# Patient Record
Sex: Female | Born: 1935 | Race: White | Hispanic: No | Smoking: Never smoker
Health system: Southern US, Community
[De-identification: ages and names within clinical notes are randomized; demographics above are authoritative.]

## PROBLEM LIST (undated history)

## (undated) DIAGNOSIS — I4891 Unspecified atrial fibrillation: Secondary | ICD-10-CM

## (undated) DIAGNOSIS — J449 Chronic obstructive pulmonary disease, unspecified: Secondary | ICD-10-CM

## (undated) DIAGNOSIS — I639 Cerebral infarction, unspecified: Secondary | ICD-10-CM

## (undated) DIAGNOSIS — F0390 Unspecified dementia without behavioral disturbance: Secondary | ICD-10-CM

## (undated) DIAGNOSIS — I2510 Atherosclerotic heart disease of native coronary artery without angina pectoris: Secondary | ICD-10-CM

## (undated) DIAGNOSIS — J45909 Unspecified asthma, uncomplicated: Secondary | ICD-10-CM

## (undated) DIAGNOSIS — I10 Essential (primary) hypertension: Secondary | ICD-10-CM

## (undated) DIAGNOSIS — N189 Chronic kidney disease, unspecified: Secondary | ICD-10-CM

## (undated) HISTORY — PX: APPENDECTOMY: SHX54

## (undated) HISTORY — PX: OTHER SURGICAL HISTORY: SHX169

---

## 2019-02-15 ENCOUNTER — Inpatient Hospital Stay (HOSPITAL_COMMUNITY)
Admission: EM | Admit: 2019-02-15 | Discharge: 2019-02-28 | DRG: 308 | Disposition: A | Discharge status: Skilled Nursing Facility | Payer: Medicare Other | Source: Other Acute Inpatient Hospital | Attending: Family Medicine | Admitting: Family Medicine

## 2019-02-15 DIAGNOSIS — I4819 Other persistent atrial fibrillation: Secondary | ICD-10-CM | POA: Diagnosis not present

## 2019-02-15 DIAGNOSIS — Z7189 Other specified counseling: Secondary | ICD-10-CM

## 2019-02-15 DIAGNOSIS — N12 Tubulo-interstitial nephritis, not specified as acute or chronic: Secondary | ICD-10-CM | POA: Diagnosis not present

## 2019-02-15 DIAGNOSIS — M899 Disorder of bone, unspecified: Secondary | ICD-10-CM | POA: Diagnosis not present

## 2019-02-15 DIAGNOSIS — Z66 Do not resuscitate: Secondary | ICD-10-CM | POA: Diagnosis not present

## 2019-02-15 DIAGNOSIS — N390 Urinary tract infection, site not specified: Secondary | ICD-10-CM | POA: Diagnosis present

## 2019-02-15 DIAGNOSIS — Z7901 Long term (current) use of anticoagulants: Secondary | ICD-10-CM | POA: Diagnosis not present

## 2019-02-15 DIAGNOSIS — I959 Hypotension, unspecified: Secondary | ICD-10-CM | POA: Diagnosis present

## 2019-02-15 DIAGNOSIS — R627 Adult failure to thrive: Secondary | ICD-10-CM | POA: Diagnosis not present

## 2019-02-15 DIAGNOSIS — L8990 Pressure ulcer of unspecified site, unspecified stage: Secondary | ICD-10-CM | POA: Diagnosis present

## 2019-02-15 DIAGNOSIS — I2510 Atherosclerotic heart disease of native coronary artery without angina pectoris: Secondary | ICD-10-CM | POA: Diagnosis present

## 2019-02-15 DIAGNOSIS — Z9981 Dependence on supplemental oxygen: Secondary | ICD-10-CM | POA: Diagnosis not present

## 2019-02-15 DIAGNOSIS — R222 Localized swelling, mass and lump, trunk: Secondary | ICD-10-CM | POA: Diagnosis present

## 2019-02-15 DIAGNOSIS — J45909 Unspecified asthma, uncomplicated: Secondary | ICD-10-CM | POA: Diagnosis present

## 2019-02-15 DIAGNOSIS — I714 Abdominal aortic aneurysm, without rupture, unspecified: Secondary | ICD-10-CM | POA: Diagnosis present

## 2019-02-15 DIAGNOSIS — Z20828 Contact with and (suspected) exposure to other viral communicable diseases: Secondary | ICD-10-CM | POA: Diagnosis present

## 2019-02-15 DIAGNOSIS — E8770 Fluid overload, unspecified: Secondary | ICD-10-CM | POA: Diagnosis not present

## 2019-02-15 DIAGNOSIS — E079 Disorder of thyroid, unspecified: Secondary | ICD-10-CM

## 2019-02-15 DIAGNOSIS — F0150 Vascular dementia without behavioral disturbance: Secondary | ICD-10-CM | POA: Diagnosis present

## 2019-02-15 DIAGNOSIS — Z515 Encounter for palliative care: Secondary | ICD-10-CM

## 2019-02-15 DIAGNOSIS — Z9889 Other specified postprocedural states: Secondary | ICD-10-CM | POA: Diagnosis not present

## 2019-02-15 DIAGNOSIS — I360 Nonrheumatic tricuspid (valve) stenosis: Secondary | ICD-10-CM | POA: Diagnosis not present

## 2019-02-15 DIAGNOSIS — S22000A Wedge compression fracture of unspecified thoracic vertebra, initial encounter for closed fracture: Secondary | ICD-10-CM | POA: Diagnosis not present

## 2019-02-15 DIAGNOSIS — Z955 Presence of coronary angioplasty implant and graft: Secondary | ICD-10-CM | POA: Diagnosis not present

## 2019-02-15 DIAGNOSIS — J90 Pleural effusion, not elsewhere classified: Secondary | ICD-10-CM | POA: Diagnosis not present

## 2019-02-15 DIAGNOSIS — Z6821 Body mass index (BMI) 21.0-21.9, adult: Secondary | ICD-10-CM

## 2019-02-15 DIAGNOSIS — N189 Chronic kidney disease, unspecified: Secondary | ICD-10-CM | POA: Diagnosis not present

## 2019-02-15 DIAGNOSIS — M8008XA Age-related osteoporosis with current pathological fracture, vertebra(e), initial encounter for fracture: Secondary | ICD-10-CM | POA: Diagnosis present

## 2019-02-15 DIAGNOSIS — E049 Nontoxic goiter, unspecified: Secondary | ICD-10-CM | POA: Diagnosis present

## 2019-02-15 DIAGNOSIS — I4891 Unspecified atrial fibrillation: Secondary | ICD-10-CM | POA: Diagnosis present

## 2019-02-15 DIAGNOSIS — L89151 Pressure ulcer of sacral region, stage 1: Secondary | ICD-10-CM | POA: Diagnosis present

## 2019-02-15 DIAGNOSIS — Z888 Allergy status to other drugs, medicaments and biological substances status: Secondary | ICD-10-CM

## 2019-02-15 DIAGNOSIS — I340 Nonrheumatic mitral (valve) insufficiency: Secondary | ICD-10-CM | POA: Diagnosis not present

## 2019-02-15 DIAGNOSIS — I69320 Aphasia following cerebral infarction: Secondary | ICD-10-CM | POA: Diagnosis not present

## 2019-02-15 DIAGNOSIS — N183 Chronic kidney disease, stage 3 (moderate): Secondary | ICD-10-CM | POA: Diagnosis present

## 2019-02-15 DIAGNOSIS — E785 Hyperlipidemia, unspecified: Secondary | ICD-10-CM | POA: Diagnosis present

## 2019-02-15 DIAGNOSIS — J9621 Acute and chronic respiratory failure with hypoxia: Secondary | ICD-10-CM | POA: Diagnosis present

## 2019-02-15 DIAGNOSIS — D34 Benign neoplasm of thyroid gland: Secondary | ICD-10-CM | POA: Diagnosis not present

## 2019-02-15 DIAGNOSIS — R229 Localized swelling, mass and lump, unspecified: Secondary | ICD-10-CM

## 2019-02-15 DIAGNOSIS — R4182 Altered mental status, unspecified: Secondary | ICD-10-CM | POA: Diagnosis present

## 2019-02-15 DIAGNOSIS — J449 Chronic obstructive pulmonary disease, unspecified: Secondary | ICD-10-CM | POA: Diagnosis present

## 2019-02-15 DIAGNOSIS — R0902 Hypoxemia: Secondary | ICD-10-CM | POA: Diagnosis present

## 2019-02-15 DIAGNOSIS — G92 Toxic encephalopathy: Secondary | ICD-10-CM | POA: Diagnosis present

## 2019-02-15 DIAGNOSIS — IMO0002 Reserved for concepts with insufficient information to code with codable children: Secondary | ICD-10-CM

## 2019-02-15 DIAGNOSIS — I129 Hypertensive chronic kidney disease with stage 1 through stage 4 chronic kidney disease, or unspecified chronic kidney disease: Secondary | ICD-10-CM | POA: Diagnosis not present

## 2019-02-15 DIAGNOSIS — E876 Hypokalemia: Secondary | ICD-10-CM | POA: Diagnosis present

## 2019-02-15 DIAGNOSIS — F0390 Unspecified dementia without behavioral disturbance: Secondary | ICD-10-CM | POA: Diagnosis not present

## 2019-02-15 DIAGNOSIS — J439 Emphysema, unspecified: Secondary | ICD-10-CM | POA: Diagnosis not present

## 2019-02-15 DIAGNOSIS — I480 Paroxysmal atrial fibrillation: Principal | ICD-10-CM | POA: Diagnosis present

## 2019-02-15 DIAGNOSIS — E041 Nontoxic single thyroid nodule: Secondary | ICD-10-CM | POA: Diagnosis not present

## 2019-02-15 HISTORY — DX: Unspecified atrial fibrillation: I48.91

## 2019-02-15 HISTORY — DX: Chronic obstructive pulmonary disease, unspecified: J44.9

## 2019-02-15 HISTORY — DX: Essential (primary) hypertension: I10

## 2019-02-15 HISTORY — DX: Chronic kidney disease, unspecified: N18.9

## 2019-02-15 HISTORY — DX: Unspecified dementia, unspecified severity, without behavioral disturbance, psychotic disturbance, mood disturbance, and anxiety: F03.90

## 2019-02-15 HISTORY — DX: Unspecified asthma, uncomplicated: J45.909

## 2019-02-15 HISTORY — DX: Atherosclerotic heart disease of native coronary artery without angina pectoris: I25.10

## 2019-02-15 HISTORY — DX: Cerebral infarction, unspecified: I63.9

## 2019-02-15 MED ORDER — CHLORHEXIDINE GLUCONATE CLOTH 2 % EX PADS
6.0000 | MEDICATED_PAD | Freq: Every day | CUTANEOUS | Status: DC
  Administered 2019-02-16 – 2019-02-21 (×6): 6 via TOPICAL

## 2019-02-16 ENCOUNTER — Encounter (HOSPITAL_COMMUNITY): Payer: Self-pay | Admitting: Internal Medicine

## 2019-02-16 ENCOUNTER — Inpatient Hospital Stay (HOSPITAL_COMMUNITY): Payer: Medicare Other

## 2019-02-16 DIAGNOSIS — J90 Pleural effusion, not elsewhere classified: Secondary | ICD-10-CM | POA: Diagnosis present

## 2019-02-16 DIAGNOSIS — E049 Nontoxic goiter, unspecified: Secondary | ICD-10-CM | POA: Diagnosis present

## 2019-02-16 DIAGNOSIS — I360 Nonrheumatic tricuspid (valve) stenosis: Secondary | ICD-10-CM

## 2019-02-16 DIAGNOSIS — I2510 Atherosclerotic heart disease of native coronary artery without angina pectoris: Secondary | ICD-10-CM

## 2019-02-16 DIAGNOSIS — I4891 Unspecified atrial fibrillation: Secondary | ICD-10-CM

## 2019-02-16 DIAGNOSIS — J45909 Unspecified asthma, uncomplicated: Secondary | ICD-10-CM | POA: Diagnosis present

## 2019-02-16 DIAGNOSIS — I714 Abdominal aortic aneurysm, without rupture, unspecified: Secondary | ICD-10-CM | POA: Diagnosis present

## 2019-02-16 DIAGNOSIS — R0902 Hypoxemia: Secondary | ICD-10-CM

## 2019-02-16 DIAGNOSIS — I4819 Other persistent atrial fibrillation: Secondary | ICD-10-CM

## 2019-02-16 DIAGNOSIS — L8990 Pressure ulcer of unspecified site, unspecified stage: Secondary | ICD-10-CM

## 2019-02-16 DIAGNOSIS — R4182 Altered mental status, unspecified: Secondary | ICD-10-CM

## 2019-02-16 DIAGNOSIS — Z955 Presence of coronary angioplasty implant and graft: Secondary | ICD-10-CM

## 2019-02-16 DIAGNOSIS — E079 Disorder of thyroid, unspecified: Secondary | ICD-10-CM | POA: Diagnosis present

## 2019-02-16 DIAGNOSIS — S22000A Wedge compression fracture of unspecified thoracic vertebra, initial encounter for closed fracture: Secondary | ICD-10-CM | POA: Diagnosis present

## 2019-02-16 DIAGNOSIS — I340 Nonrheumatic mitral (valve) insufficiency: Secondary | ICD-10-CM

## 2019-02-16 DIAGNOSIS — N390 Urinary tract infection, site not specified: Secondary | ICD-10-CM | POA: Diagnosis present

## 2019-02-16 LAB — URINALYSIS, ROUTINE W REFLEX MICROSCOPIC
Bilirubin Urine: NEGATIVE
Glucose, UA: NEGATIVE mg/dL
Ketones, ur: NEGATIVE mg/dL
Nitrite: NEGATIVE
Protein, ur: NEGATIVE mg/dL
Specific Gravity, Urine: 1.012 (ref 1.005–1.030)
pH: 6 (ref 5.0–8.0)

## 2019-02-16 LAB — COMPREHENSIVE METABOLIC PANEL
ALT: 152 U/L — ABNORMAL HIGH (ref 0–44)
AST: 62 U/L — ABNORMAL HIGH (ref 15–41)
Albumin: 3.6 g/dL (ref 3.5–5.0)
Alkaline Phosphatase: 120 U/L (ref 38–126)
Anion gap: 13 (ref 5–15)
BUN: 49 mg/dL — ABNORMAL HIGH (ref 8–23)
CO2: 26 mmol/L (ref 22–32)
Calcium: 8.8 mg/dL — ABNORMAL LOW (ref 8.9–10.3)
Chloride: 97 mmol/L — ABNORMAL LOW (ref 98–111)
Creatinine, Ser: 1.71 mg/dL — ABNORMAL HIGH (ref 0.44–1.00)
GFR calc Af Amer: 32 mL/min — ABNORMAL LOW (ref >60)
GFR calc non Af Amer: 28 mL/min — ABNORMAL LOW (ref >60)
Glucose, Bld: 124 mg/dL — ABNORMAL HIGH (ref 70–99)
Potassium: 4.1 mmol/L (ref 3.5–5.1)
Sodium: 136 mmol/L (ref 135–145)
Total Bilirubin: 1.4 mg/dL — ABNORMAL HIGH (ref 0.3–1.2)
Total Protein: 6.3 g/dL — ABNORMAL LOW (ref 6.5–8.1)

## 2019-02-16 LAB — ECHOCARDIOGRAM COMPLETE
Height: 62 in
Weight: 1904.77 oz

## 2019-02-16 LAB — APTT
aPTT: 31 seconds (ref 24–36)
aPTT: 82 seconds — ABNORMAL HIGH (ref 24–36)

## 2019-02-16 LAB — MAGNESIUM: Magnesium: 2.1 mg/dL (ref 1.7–2.4)

## 2019-02-16 LAB — PROTIME-INR
INR: 1.6 — ABNORMAL HIGH (ref 0.8–1.2)
Prothrombin Time: 18.5 seconds — ABNORMAL HIGH (ref 11.4–15.2)

## 2019-02-16 LAB — CBC
HCT: 39.8 % (ref 36.0–46.0)
Hemoglobin: 11.4 g/dL — ABNORMAL LOW (ref 12.0–15.0)
MCH: 23.8 pg — ABNORMAL LOW (ref 26.0–34.0)
MCHC: 28.6 g/dL — ABNORMAL LOW (ref 30.0–36.0)
MCV: 83.1 fL (ref 80.0–100.0)
Platelets: 175 10*3/uL (ref 150–400)
RBC: 4.79 MIL/uL (ref 3.87–5.11)
RDW: 21.2 % — ABNORMAL HIGH (ref 11.5–15.5)
WBC: 9.7 10*3/uL (ref 4.0–10.5)
nRBC: 0.9 % — ABNORMAL HIGH (ref 0.0–0.2)

## 2019-02-16 LAB — T4, FREE: Free T4: 0.87 ng/dL (ref 0.82–1.77)

## 2019-02-16 LAB — HEPARIN LEVEL (UNFRACTIONATED)
Heparin Unfractionated: 0.86 IU/mL — ABNORMAL HIGH (ref 0.30–0.70)
Heparin Unfractionated: 0.71 IU/mL — ABNORMAL HIGH (ref 0.30–0.70)

## 2019-02-16 LAB — SODIUM, URINE, RANDOM: Sodium, Ur: 40 mmol/L

## 2019-02-16 LAB — MRSA PCR SCREENING: MRSA by PCR: NEGATIVE

## 2019-02-16 LAB — TROPONIN I: Troponin I: <0.03 ng/mL (ref <0.03)

## 2019-02-16 LAB — TSH: TSH: 4.301 u[IU]/mL (ref 0.350–4.500)

## 2019-02-16 LAB — CREATININE, URINE, RANDOM: Creatinine, Urine: 62.92 mg/dL

## 2019-02-16 MED ORDER — ONDANSETRON HCL 4 MG/2ML IJ SOLN
4.0000 mg | Freq: Four times a day (QID) | INTRAMUSCULAR | Status: DC | PRN
  Administered 2019-02-17 – 2019-02-23 (×2): 4 mg via INTRAVENOUS
  Filled 2019-02-16 (×2): qty 2

## 2019-02-16 MED ORDER — DILTIAZEM HCL 100 MG IV SOLR
5.0000 mg/h | INTRAVENOUS | Status: DC
  Administered 2019-02-16: 15:00:00 7.5 mg/h via INTRAVENOUS
  Administered 2019-02-16 – 2019-02-18 (×2): 5 mg/h via INTRAVENOUS
  Filled 2019-02-16 (×6): qty 100

## 2019-02-16 MED ORDER — SODIUM CHLORIDE 0.9 % IV SOLN
1.0000 g | INTRAVENOUS | Status: DC
  Administered 2019-02-16 – 2019-02-18 (×3): 1 g via INTRAVENOUS
  Filled 2019-02-16: qty 1
  Filled 2019-02-16 (×5): qty 10

## 2019-02-16 MED ORDER — HEPARIN (PORCINE) 25000 UT/250ML-% IV SOLN: 700.0000 [IU]/h | INTRAVENOUS | Status: DC

## 2019-02-16 MED ORDER — PERFLUTREN LIPID MICROSPHERE
1.0000 mL | INTRAVENOUS | Status: AC | PRN
  Administered 2019-02-16: 08:00:00 3 mL via INTRAVENOUS
  Filled 2019-02-16: qty 10

## 2019-02-16 MED ORDER — HEPARIN (PORCINE) 25000 UT/250ML-% IV SOLN
650.0000 [IU]/h | INTRAVENOUS | Status: DC
  Filled 2019-02-16: qty 250

## 2019-02-16 MED ORDER — HEPARIN BOLUS VIA INFUSION
3000.0000 [IU] | Freq: Once | INTRAVENOUS | Status: AC
  Administered 2019-02-16: 02:00:00 3000 [IU] via INTRAVENOUS
  Filled 2019-02-16: qty 3000

## 2019-02-16 MED ORDER — ONDANSETRON HCL 4 MG PO TABS: 4.0000 mg | ORAL_TABLET | Freq: Four times a day (QID) | ORAL | Status: DC | PRN

## 2019-02-16 MED ORDER — SODIUM CHLORIDE 0.45 % IV SOLN
INTRAVENOUS | Status: DC
  Administered 2019-02-16 – 2019-02-23 (×8): via INTRAVENOUS

## 2019-02-16 MED ORDER — HEPARIN (PORCINE) 25000 UT/250ML-% IV SOLN
800.0000 [IU]/h | INTRAVENOUS | Status: DC
  Administered 2019-02-16: 02:00:00 800 [IU]/h via INTRAVENOUS
  Filled 2019-02-16: qty 250

## 2019-02-16 MED ORDER — ORAL CARE MOUTH RINSE
15.0000 mL | Freq: Two times a day (BID) | OROMUCOSAL | Status: DC
  Administered 2019-02-16 – 2019-02-28 (×21): 15 mL via OROMUCOSAL

## 2019-02-16 MED ORDER — SODIUM CHLORIDE 0.9 % IV BOLUS
500.0000 mL | Freq: Once | INTRAVENOUS | Status: AC
  Administered 2019-02-16: 03:00:00 500 mL via INTRAVENOUS

## 2019-02-16 MED ORDER — METOPROLOL TARTRATE 25 MG PO TABS
12.5000 mg | ORAL_TABLET | Freq: Four times a day (QID) | ORAL | Status: DC
  Administered 2019-02-16 – 2019-02-25 (×33): 12.5 mg via ORAL
  Filled 2019-02-16 (×34): qty 1

## 2019-02-16 NOTE — Progress Notes (Signed)
ECG completed and placed in patient's chart.

## 2019-02-16 NOTE — Progress Notes (Signed)
  Echocardiogram 2D Echocardiogram with definity has been performed.  Darlina Sicilian M 02/16/2019, 8:22 AM

## 2019-02-16 NOTE — Progress Notes (Addendum)
Pharmacy - IV heparin  Assessment:    Please see note from Dia Sitter, PharmD earlier today for full details.  Briefly, 83 y.o. female on IV heparin for Afib. Some question about whether or not patient was on Xarelto PTA, but patient's pharmacy does not have this Rx on file.   Most recent heparin level borderline high at 0.71 on 700 units/hr  Plan:   Will reduce heparin rate to 650 units/hr  To rule out the possibility that patient was on a DOAC PTA (which would be consistent with elevated heparin levels), will add aPTT to this lab to confirm therapeutic level  Reuel Boom, PharmD, BCPS [phone] 02/16/2019, 10:03 PM\   Addendum: aptt = 82 sec at goal and correlates with HL, will continue heparin drip at 650 units/hr Recheck HL in am  Thanks Dorrene German 02/17/2019 12:31 AM

## 2019-02-16 NOTE — H&P (Signed)
History and Physical   Carla Kemp OXB:[id number] DOB: [birth year] DOA: 02/15/2019  Referring MD/NP/PA: [facility], [place]  PCP: [name], MD   Outpatient Specialists: None  Patient coming from: [facility]  Chief Complaint: Altered mental status cough and hypoxia  HPI: Carla Kemp is a 83 y.o. female with medical history significant of recent UTI with altered mental status, paroxysmal atrial fibrillation, asthma, history of goiter, COPD or asthma, who was recently discharged from the hospital after altered mental status with UTI.  She went home and was living with her daughter who now brought her back to [place] with more confusion dementia and generalized weakness.  Patient was found to have atrial fibrillation with rapid ventricular response.  Initially given Cardizem that decrease the heart rate.  She was also found to have oxygen saturation of 89% on room air which later improved to 97% on 2 L.  She was reported to have had intermittent cough.  As part of her evaluation patient had head CT without contrast that showed small 50 mm left parietal lytic bone lesions.  She also had CT chest that showed small to moderate bilateral pleural effusions and right lower lobe consolidation versus atelectasis.  Further find of 30 mm of infrarenal abdominal aneurysm and multiple thoracic compression fractures age indeterminate wide found.  She had a chest x-ray that showed bilateral pleural effusion and possible right mass versus atelectasis.  On the CT chest that was moderate mass-effect on the narrowing of the thoracic inlet trachea with a large abnormal thyroid lobe or mass which was greater than 7 cm invading adjacent soft tissue.  At this point there is suspicion for right-sided goiter versus malignancy.  With the patient's lytic bones thyroid cancer was suspected.  She is confused but airway is protected.  She is not in any respiratory distress at the moment.   COVID-19 test was done which was negative.  Thyroid panel so far negative LDH 681.  She has a creatinine 1.6 with BUN 53 otherwise unknown if this is chronic.  She has normal white count.  Patient was transferred here with atrial fibrillation and rapid ventricular response.  Suspicion for malignancy so she could have oncology consultation..  ED Course: Labs are reviewed per [facility] health above  Review of Systems: As per HPI otherwise 10 point review of systems negative.    Past Medical History:  Diagnosis Date   Asthma    Chronic kidney disease    COPD (chronic obstructive pulmonary disease) ([place])    Dementia ([place])    Hypertension     History reviewed. No pertinent surgical history.   reports that she has never smoked. She has never used smokeless tobacco. No history on file for alcohol and drug.  No Known Allergies  History reviewed. No pertinent family history.   Prior to Admission medications   Not on File    Physical Exam: Vitals:   02/16/19 0200 02/16/19 0300 02/16/19 0304 02/16/19 0320  BP: (!) 97/58 (!) 85/69 110/70 112/67  Pulse: 100 (!) 51 98 78  Resp: 17 18 17 20   Temp:      TempSrc:      SpO2: 98% 91% 90% 96%  Weight:      Height:          Constitutional: NAD, calm, confused Vitals:   02/16/19 0200 02/16/19 0300 02/16/19 0304 02/16/19 0320  BP: (!) 97/58 (!) 85/69 110/70 112/67  Pulse: 100 (!) 51 98 78  Resp: 17 18 17  20  Temp:      TempSrc:      SpO2: 98% 91% 90% 96%  Weight:      Height:       Eyes: PERRL, lids and conjunctivae normal ENMT: Mucous membranes are moist. Posterior pharynx clear of any exudate or lesions.Normal dentition.  Neck: normal, supple, visible thyroid mass, no thyromegaly Respiratory: Basal crackles with coarse breath sounds bilaterally, no wheezing, Normal respiratory effort. No accessory muscle use.  Cardiovascular: Irregularly irregular rate and rhythm, no murmurs / rubs / gallops. No extremity edema. 2+ pedal  pulses. No carotid bruits.  Abdomen: no tenderness, no masses palpated. No hepatosplenomegaly. Bowel sounds positive.  Musculoskeletal: no clubbing / cyanosis. No joint deformity upper and lower extremities. Good ROM, no contractures. Normal muscle tone.  Skin: no rashes, lesions, ulcers. No induration Neurologic: CN 2-12 grossly intact. Sensation intact, DTR normal. Strength 5/5 in all 4.  Psychiatric: Confused with mild agitation    Labs on Admission: I have personally reviewed following labs and imaging studies  CBC: Recent Labs  Lab 02/16/19 0047  WBC 9.7  HGB 11.4*  HCT 39.8  MCV 83.1  PLT 914   Basic Metabolic Panel: Recent Labs  Lab 02/16/19 0047  NA 136  K 4.1  CL 97*  CO2 26  GLUCOSE 124*  BUN 49*  CREATININE 1.71*  CALCIUM 8.8*   GFR: Estimated Creatinine Clearance: 20.4 mL/min (A) (by C-G formula based on SCr of 1.71 mg/dL (H)). Liver Function Tests: Recent Labs  Lab 02/16/19 0047  AST 62*  ALT 152*  ALKPHOS 120  BILITOT 1.4*  PROT 6.3*  ALBUMIN 3.6   No results for input(s): LIPASE, AMYLASE in the last 168 hours. No results for input(s): AMMONIA in the last 168 hours. Coagulation Profile: Recent Labs  Lab 02/16/19 0046  INR 1.6*   Cardiac Enzymes: No results for input(s): CKTOTAL, CKMB, CKMBINDEX, TROPONINI in the last 168 hours. BNP (last 3 results) No results for input(s): PROBNP in the last 8760 hours. HbA1C: No results for input(s): HGBA1C in the last 72 hours. CBG: No results for input(s): GLUCAP in the last 168 hours. Lipid Profile: No results for input(s): CHOL, HDL, LDLCALC, TRIG, CHOLHDL, LDLDIRECT in the last 72 hours. Thyroid Function Tests: No results for input(s): TSH, T4TOTAL, FREET4, T3FREE, THYROIDAB in the last 72 hours. Anemia Panel: No results for input(s): VITAMINB12, FOLATE, FERRITIN, TIBC, IRON, RETICCTPCT in the last 72 hours. Urine analysis: No results found for: COLORURINE, APPEARANCEUR, LABSPEC, PHURINE,  GLUCOSEU, HGBUR, BILIRUBINUR, KETONESUR, PROTEINUR, UROBILINOGEN, NITRITE, LEUKOCYTESUR Sepsis Labs: @LABRCNTIP (procalcitonin:4,lacticidven:4) ) Recent Results (from the past 240 hour(s))  MRSA PCR Screening     Status: None   Collection Time: 02/15/19 11:43 PM  Result Value Ref Range Status   MRSA by PCR NEGATIVE NEGATIVE Final    Comment:        The GeneXpert MRSA Assay (FDA approved for NASAL specimens only), is one component of a comprehensive MRSA colonization surveillance program. It is not intended to diagnose MRSA infection nor to guide or monitor treatment for MRSA infections. Performed at [facility], [place] [address] [place], [place] [address]      Radiological Exams on Admission: No results found.   Assessment/Plan Principal Problem:   A-fib (HCC) Active Problems:   Asthma   Acute lower UTI   Hypoxia   Pleural effusion, bilateral   Goiter   AAA (abdominal aortic aneurysm) (HCC)   Compression fracture of body of thoracic vertebra (HCC)  Thyroid mass   Altered mental status   Pressure injury of skin   #1 A. fib with RVR: Patient will be admitted to stepdown unit.  Rate currently in the 140s.  Initiate Cardizem drip and titrate.  IV heparin drip.  Obtain home regimen and update medication list.  Oral agents will be added and titrated.  Echocardiogram will be ordered in the morning and possible cardiology consultation.  #2 thyroid mass versus large goiter: With pressure at the thoracic outlet.  Patient had what appeared to be lytic bones and compression fractures.  Oncology will be consulted.  Possible IR needle biopsy.  Review thyroid studies.  #3 altered mental status: Patient is not able to give adequate history at this point.  Could be due to acute illness or underlying dementia.  Continue monitoring mental status.  #4 hypoxia: Most likely due to asthma and COPD.  No evidence of pneumonia on x-rays.  Mass seen in the chest appears to be  thyroid mass.  #5 multiple thoracic compression fractures: Age indeterminate.  No reported injuries or falls.  Supportive care mainly.  #5 recent UTI: Recheck urinalysis and if infected restart antibiotics.  #6 bilateral pleural effusion: May be malignant effusion.  Reviewed x-ray and see if it is significant enough for drainage.  #7 pressure ulcer: Incidental finding.  Wound care consult  #8 abdominal aortic aneurysm: Also incidental finding on CT scan.  Close follow-up will be advised.  DVT prophylaxis: Heparin drip Code Status: Full code Family Communication: Daughter is primary historian available over the phone Disposition Plan: To be determined Consults called: Cardiology and oncology consults to be called in the morning Admission status: Inpatient  Severity of Illness: The appropriate patient status for this patient is INPATIENT. Inpatient status is judged to be reasonable and necessary in order to provide the required intensity of service to ensure the patient's safety. The patient's presenting symptoms, physical exam findings, and initial radiographic and laboratory data in the context of their chronic comorbidities is felt to place them at high risk for further clinical deterioration. Furthermore, it is not anticipated that the patient will be medically stable for discharge from the hospital within 2 midnights of admission. The following factors support the patient status of inpatient.   " The patient's presenting symptoms include altered mental status and shortness of breath. " The worrisome physical exam findings include bilateral crackles and decreased air entry with rapid A. fib. " The initial radiographic and laboratory data are worrisome because of CT scan findings of thyroid masses. " The chronic co-morbidities include asthma COPD.   * I certify that at the point of admission it is my clinical judgment that the patient will require inpatient hospital care spanning beyond  2 midnights from the point of admission due to high intensity of service, high risk for further deterioration and high frequency of surveillance required.Barbette Merino MD [facility] Pager [phone]  If 7PM-7AM, please contact night-coverage [email or web address] Password [facility]  02/16/2019, 3:54 AM

## 2019-02-16 NOTE — Consult Note (Addendum)
Cardiology Consultation:   Patient ID: Dilan Fullenwider MRN: [id number]; DOB: 1936-09-29  Admit date: 02/15/2019 Date of Consult: 02/16/2019  Primary Care Provider: [name], MD Primary Cardiologist: [facility] in [place], [place] Primary Electrophysiologist:  None    Patient Profile:   Carla Kemp is a 83 y.o. female with a hx of paroxysmal atrial fibrillation, asthma vs. COPD, recent hospitalization for for UTI with altered mental status who is being seen today for the evaluation of atrial fibrillation with rapid ventricular response at the request of Dr. Irine Seal.  History of Present Illness:   Ms. Sullivant is COVID negative.  She has confusion/altered mental status at this time. She denies chest pain or trouble breathing, but she isn't sure why she is here. She denies a history of heart failure but says she has had stents in the past, last she thinks about 5 years ago.   There are no records in our system. She presented to [facility] with altered mental status and afib RVR, and she was transferred to [facility] for oncology/interventional radiology evaluation of chest mass and possible lytic lesions.  Review of [facility] chart sent with her yields the following: Brought in for progressive confusion. Had CT head without noted intraparenchymal changes but CT skull with bony mass. CXR with bilateral pleural effusions and RLL consolidation. CT chest showed multiple spinal compression fractures, osteopenia, and a thoracic mass. Recommendation was for transfer for further evaluation of this mass.   Chart notes allergy to amiodarone, unclear reaction.  INR 1.5 at [facility]. COVID negative. Cr 1.60 , unknown baseline. Lactate 1.7. Mg 2.2, K 4.2. TnI <0.01. proBNP 8640  Per notes, she has a history of  CVA, HLD, atrial fibrillation, HTN, MI, cardiac cath/stent, use of home O2, COPD.  There is report of recently needing 2 units of blood transfused for anemia during  recent hospitalization. Etiology of this anemia is unclear to me.  She has only a summary in [facility] with a variety of medications listed, unclear what she is actually taking. Noted that amiodarone, amlodipine, aspirin, diltiazem, furosemide, metoprolol, verapamil, xarelto all on med list, but given allergies/interactions this is likely inaccurate. Care Everywhere also notes stent placement x4, no locations/dates. It does not look like like there are updated encounters since 04/2018.  Hospitalist physician is attempting to get additional history at this time from family. She apparently follows with [facility] in [place], but these records are not available in Care Everywhere  Past Medical History:  Diagnosis Date  . Asthma   . Atrial fibrillation ([place])   . Chronic kidney disease   . COPD (chronic obstructive pulmonary disease) ([place])   . Coronary artery disease   . Dementia ([place])   . Hypertension   . Stroke [facility])     Past Surgical History:  Procedure Laterality Date  . APPENDECTOMY    . Cardiac stents x 4    . right elbow surgery       Home Medications:  Prior to Admission medications   Medication Sig Start Date End Date Taking? Authorizing Provider  albuterol (PROVENTIL) (2.5 MG/3ML) 0.083% nebulizer solution Take 3 mLs by nebulization every 6 (six) hours as needed for wheezing. 12/30/18  Yes [provider]  diltiazem (CARDIZEM) 120 MG tablet Take 120 mg by mouth daily. 01/24/19  Yes [provider]  furosemide (LASIX) 40 MG tablet Take 40 mg by mouth daily. 10/19/18  Yes [provider]  potassium chloride (K-DUR) 10 MEQ tablet Take 10  mEq by mouth daily. 12/31/18  Yes [provider]    Inpatient Medications: Scheduled Meds: . Chlorhexidine Gluconate Cloth  6 each Topical Daily  . mouth rinse  15 mL Mouth Rinse BID   Continuous Infusions: . sodium chloride 50 mL/hr at 02/16/19 0600  . cefTRIAXone (ROCEPHIN)  IV    .  diltiazem (CARDIZEM) infusion 5 mg/hr (02/16/19 0600)  . heparin 800 Units/hr (02/16/19 0600)   PRN Meds: ondansetron **OR** ondansetron (ZOFRAN) IV  Allergies:    Allergies  Allergen Reactions  . Amiodarone Other (See Comments)    Respiratory issues, hair loss    Social History:   Social History   Socioeconomic History  . Marital status: Married    Spouse name: Not on file  . Number of children: Not on file  . Years of education: Not on file  . Highest education level: Not on file  Occupational History  . Not on file  Social Needs  . Financial resource strain: Not on file  . Food insecurity:    Worry: Not on file    Inability: Not on file  . Transportation needs:    Medical: Not on file    Non-medical: Not on file  Tobacco Use  . Smoking status: Never Smoker  . Smokeless tobacco: Never Used  Substance and Sexual Activity  . Alcohol use: Not on file  . Drug use: Not on file  . Sexual activity: Not on file  Lifestyle  . Physical activity:    Days per week: Not on file    Minutes per session: Not on file  . Stress: Not on file  Relationships  . Social connections:    Talks on phone: Not on file    Gets together: Not on file    Attends religious service: Not on file    Active member of club or organization: Not on file    Attends meetings of clubs or organizations: Not on file    Relationship status: Not on file  . Intimate partner violence:    Fear of current or ex partner: Not on file    Emotionally abused: Not on file    Physically abused: Not on file    Forced sexual activity: Not on file  Other Topics Concern  . Not on file  Social History Narrative  . Not on file    Family History:   History reviewed. No pertinent family history. Unable to get additional history from patient.  ROS:  Unable to obtain due to altered mental status  Physical Exam/Data:   Vitals:   02/16/19 0401 02/16/19 0500 02/16/19 0530 02/16/19 0600  BP:  (!) 98/55 (!) 118/59  (!) 120/58  Pulse: 82 (!) 35 69 (!) 114  Resp: 18 12 12 12   Temp: 98 F (36.7 C)     TempSrc: Oral     SpO2: 93% 100% 100% 96%  Weight:      Height:        Intake/Output Summary (Last 24 hours) at 02/16/2019 1113 Last data filed at 02/16/2019 0600 Gross per 24 hour  Intake 787.84 ml  Output 250 ml  Net 537.84 ml   Last 3 Weights 02/16/2019  Weight (lbs) 119 lb 0.8 oz  Weight (kg) 54 kg     Body mass index is 21.77 kg/m.  General:  Frail appearing elderly woman, resting in bed but awakens easily, in NAD HEENT: normal, nasal cannula in place Neck: JVD about 12 cm Endocrine:  thryomegaly Vascular: No  carotid bruits; RA pulses 2+ bilaterally Cardiac: irregular irregular S1 and S2, tachycardia; no murmur Lungs:  clear to auscultation bilaterally in upper fields, diminished at the bases  Abd: soft, nontender, no hepatomegaly  Ext: no edema Musculoskeletal:  No deformities, moves all 4 limbs independently Skin: warm and dry  Neuro:  no focal abnormalities noted Psych:  Normal affect, though limited historian  EKG:  The EKG was personally reviewed and demonstrates:  afib RVR at 103 bpm Telemetry:  Telemetry was personally reviewed and demonstrates:  Atrial fibrillation, ranging from upper 90 bpm to 110s bpm  Relevant CV Studies: I personally read her echo this morning.    1. The left ventricle has low normal systolic function, with an ejection fraction of 50-55%. The cavity size was normal. Left ventricular diastolic function could not be evaluated secondary to atrial fibrillation.  2. Very difficult imaging, no clear WMA but sensitivity reduced based on limitations of the study.  3. Left atrial size was moderately dilated.  4. The mitral valve is grossly normal.  5. The tricuspid valve is grossly normal. Tricuspid valve regurgitation is moderate.  6. The aortic valve is grossly normal. Mild thickening of the aortic valve. Mild calcification of the aortic valve. Aortic valve  regurgitation is mild by color flow Doppler.  7. The inferior vena cava was dilated in size with <50% respiratory variability.  8. The interatrial septum was not assessed.  SUMMARY   Technically difficult study, even with use of echo contrast. In atrial fibrillation with HRs just above 100. With contrast imaging, appears to have low normal EF, but reduced sensitivity limits analysis.  Laboratory Data:  Chemistry Recent Labs  Lab 02/16/19 0047  NA 136  K 4.1  CL 97*  CO2 26  GLUCOSE 124*  BUN 49*  CREATININE 1.71*  CALCIUM 8.8*  GFRNONAA 28*  GFRAA 32*  ANIONGAP 13    Recent Labs  Lab 02/16/19 0047  PROT 6.3*  ALBUMIN 3.6  AST 62*  ALT 152*  ALKPHOS 120  BILITOT 1.4*   Hematology Recent Labs  Lab 02/16/19 0047  WBC 9.7  RBC 4.79  HGB 11.4*  HCT 39.8  MCV 83.1  MCH 23.8*  MCHC 28.6*  RDW 21.2*  PLT 175   Cardiac EnzymesNo results for input(s): TROPONINI in the last 168 hours. No results for input(s): TROPIPOC in the last 168 hours.  BNPNo results for input(s): BNP, PROBNP in the last 168 hours.  DDimer No results for input(s): DDIMER in the last 168 hours.  Radiology/Studies:  No results found.  Assessment and Plan:   1. Atrial fibrillation with RVR -CHA2DS2/VAS Stroke Risk Points=4 at least -per notes, reported prior history of paroxysmal atrial fib. Does appear that she was on a blood thinner before (xarelto), but she also received a blood transfusion recently for anemia (unknown what the etiology is to me). We do not have all her records, but it is unclear why this is. She cannot give me additional history.  -would continue heparin for now while awaiting workup. Would need more information in order to return her to Xarelto (renally dosed), such as plan for procedures, cause for anemia -reported prior reaction to amiodarone, unclear what this was (per daughter, potentially lung toxicity/alopecia) -unclear if she was on metoprolol or diltiazem  before. With difficult to evaluate echo images, will start metoprolol 12.5 mg fractionated q6 hours and attempt to wean diltiazem drip. Aim for resting heart rate around 90 bpm. -given that she has a  mediastinal mass with tracheal deviation, she is a high risk candidate for TEE, and she would need TEE prior to cardioversion as her anticoagulation history is unclear. Therefor, would not plan to cardiovert unless she becomes unstable. Would aim for rate control.  Additional cardiovascular concerns: -her echo images are very difficult. Without contrast, appears that her EF might be reduced, but with use of echo contrast appears it may be low normal function. She reportedly has a history of heart failure, unclear if this is systolic or diastolic -per notes, reportedly has history of cardiac cath/stents. Unknown location, dates, etc. Denies chest pain, initial troponin negative, ECG without acute ischemic changes -reported history of hypertension and hyperlipidemia. Has had some hypotension with RVR. -would be helpful if we could get records of cath, echo, medications, other testing from [facility] in [place].  We will continue to follow  For questions or updates, please contact [place] Please consult [email or web address] for contact info under   Signed, Buford Dresser, MD  02/16/2019 11:13 AM

## 2019-02-16 NOTE — Progress Notes (Signed)
ANTICOAGULATION CONSULT NOTE - Initial Consult  Pharmacy Consult for IV heparin Indication: atrial fibrillation  Not on File  Patient Measurements: Height: (P) 5\' 2"  (157.5 cm) Weight: (P) 119 lb 0.8 oz (54 kg) IBW/kg (Calculated) : (P) 50.1 Heparin Dosing Weight: actual  Vital Signs: Temp: 97.8 F (36.6 C) (04/29 0008) Temp Source: Oral (04/29 0008)  Labs: No results for input(s): HGB, HCT, PLT, APTT, LABPROT, INR, HEPARINUNFRC, HEPRLOWMOCWT, CREATININE, CKTOTAL, CKMB, TROPONINI in the last 72 hours.  CrCl cannot be calculated (No successful lab value found.).   Medical History: No past medical history on file.  Medications:  Scheduled:  . Chlorhexidine Gluconate Cloth  6 each Topical Daily   Infusions:  . sodium chloride    . diltiazem (CARDIZEM) infusion      Assessment: 67 yoF with altered mental status, cough found in a-fib with RVR. Not on anticoagulation PTA. CT of head is negative for acute intracranial abnormalities. IV heparin for a-fib. Baseline labs: aptt = 31,INR=1.6, H/H/Plts pending  Goal of Therapy:  Heparin level 0.3-0.7 units/ml Monitor platelets by anticoagulation protocol: Yes   Plan:  Baseline aptt and PT/INR stat Heparin 3000 unit bolus x1 Start drip at 800 units/hr Daily HL/CBC Check 1st HL in 8 hours  Dorrene German 02/16/2019,12:30 AM

## 2019-02-16 NOTE — Progress Notes (Signed)
ANTICOAGULATION CONSULT NOTE - Follow Up Consult  Pharmacy Consult for heparin Indication: atrial fibrillation with RVR  No Known Allergies  Patient Measurements: Height: 5\' 2"  (157.5 cm) Weight: 119 lb 0.8 oz (54 kg) IBW/kg (Calculated) : 50.1 Heparin Dosing Weight: 54 kg  Vital Signs: Temp: 98 F (36.7 C) (04/29 0401) Temp Source: Oral (04/29 0401) BP: 120/58 (04/29 0600) Pulse Rate: 114 (04/29 0600)  Labs: Recent Labs    02/16/19 0046 02/16/19 0047  HGB  --  11.4*  HCT  --  39.8  PLT  --  175  APTT 31  --   LABPROT 18.5*  --   INR 1.6*  --   CREATININE  --  1.71*    Estimated Creatinine Clearance: 20.4 mL/min (A) (by C-G formula based on SCr of 1.71 mg/dL (H)).   Assessment: Patient's an 83 y.o F transferred to [facility] from [facility] on 4/29 for workup of mass noted on chest CT.  Heparin drip started on admission for afib with RVR.  Today, 02/16/2019: - heparin level is supra-therapeutic at 0.86 - hgb 11.4, plts ok - INR 1.6 - AST/ALT 62/152. Tbili 1.4 - Per RN, a little bit of bleeding noted at IV site, but suspects this is from pulling on line  Goal of Therapy:  Heparin level 0.3-0.7 units/ml Monitor platelets by anticoagulation protocol: Yes   Plan:  - decrease heparin drip to 700 units/hr - check 8 hr heparin level - monitor for s/s bleeding  Carey Johndrow P 02/16/2019,8:31 AM

## 2019-02-16 NOTE — Progress Notes (Addendum)
PROGRESS NOTE    Carla Kemp  JGG:[id number] DOB: November 08, 1935 DOA: 02/15/2019 PCP: [name], MD    Brief Narrative:  HPI per Dr. Larene Pickett Vega is a 83 y.o. female with medical history significant of recent UTI with altered mental status, paroxysmal atrial fibrillation, asthma, history of goiter, COPD or asthma, who was recently discharged from the hospital after altered mental status with UTI.  She went home and was living with her daughter who now brought her back to [place] with more confusion dementia and generalized weakness.  Patient was found to have atrial fibrillation with rapid ventricular response.  Initially given Cardizem that decrease the heart rate.  She was also found to have oxygen saturation of 89% on room air which later improved to 97% on 2 L.  She was reported to have had intermittent cough.  As part of her evaluation patient had head CT without contrast that showed small 50 mm left parietal lytic bone lesions.  She also had CT chest that showed small to moderate bilateral pleural effusions and right lower lobe consolidation versus atelectasis.  Further find of 30 mm of infrarenal abdominal aneurysm and multiple thoracic compression fractures age indeterminate wide found.  She had a chest x-ray that showed bilateral pleural effusion and possible right mass versus atelectasis.  On the CT chest that was moderate mass-effect on the narrowing of the thoracic inlet trachea with a large abnormal thyroid lobe or mass which was greater than 7 cm invading adjacent soft tissue.  At this point there is suspicion for right-sided goiter versus malignancy.  With the patient's lytic bones thyroid cancer was suspected.  She is confused but airway is protected.  She is not in any respiratory distress at the moment.  COVID-19 test was done which was negative.  Thyroid panel so far negative LDH 681.  She has a creatinine 1.6 with BUN 53 otherwise unknown if this is  chronic.  She has normal white count.  Patient was transferred here with atrial fibrillation and rapid ventricular response.  Suspicion for malignancy so she could have oncology consultation..    Assessment & Plan:   Principal Problem:   A-fib ([place]) Active Problems:   Asthma   Acute lower UTI   Hypoxia   Pleural effusion, bilateral   Goiter   AAA (abdominal aortic aneurysm) (HCC)   Compression fracture of body of thoracic vertebra (HCC)   Thyroid mass   Altered mental status   Pressure injury of skin  1 paroxysmal A. fib with RVR  Patient noted to have a history of paroxysmal A. fib on oral Cardizem and Xarelto status post cardioversion x2 per daughter.  Patient presented A. fib with RVR with some confusion and generalized weakness per admitting H&P.  Patient on a Cardizem drip however blood pressure noted to be borderline with a bout of hypotension early this morning requiring a 500 cc bolus of normal saline per RN.  Heart rates ranging from the 100s to the 140s.  EKG consistent with A. fib.  Magnesium at 2.1.  Potassium at 4.1.  2D echo pending.  Patient denies any chest pain. Check a TSH and a free T4.  Continue IV heparin for anticoagulation.  Due to borderline blood pressure, will consult with cardiology for further evaluation and management.  2.  Thyroid mass versus large goiter/Lytic lesions on CT head CT with some pressure on the thoracic outlet.  Patient also noted to have what appeared to be lytic lesions and compression  fractures from outside scans.  Patient denies any shortness of breath.  Patient denies any difficulty swallowing.  Check a TSH, free T4.  Patient does state that she may have had a biopsy done on this thyroid nodule however will need to discuss with patient's family.  Unable to find any information on epic.  Patient also noted to have difficulty expressing her words and sentences which is chronic per daughter.  Per daughter patient recently hospitalized noted to be  anemic at that time requiring transfusion of 2 units packed red blood cells however no overt bleeding.  Will get UPEP/SPEP, skeletal survey.  Daughter also states that patient has had this thyroid mass and states that as long as she is swallowing okay and without any difficulties breathing she did not want anything invasive done at this time in terms of the thyroid mass.  Consult with oncology for further evaluation and management.  3.  Acute encephalopathy On admission patient noted not to be able to give any adequate history.  Felt secondary to acute illness of underlying dementia.  Patient alert this morning.  Patient following commands appropriately.  Patient with some difficulty expressing her words.  Check MRI of the head.  Urinalysis worrisome for UTI.  Check urine cultures.  Place empirically on IV Rocephin.  Continue supportive care.  4.  Hypoxia Felt likely secondary to asthma and COPD.  No evidence of pneumonia on x-rays.  Mass noted in chest appears to be thyroid mass.  Repeat chest x-ray.  Follow.  5.  Multiple thoracic compression fractures Age-indeterminate.  Supportive care.  PT/OT.  6.  UTI Urinalysis worrisome for UTI.  Check urine cultures.  Patient with some complaints of dysuria/burning when she wipes.  Place on IV Rocephin.  7.  Bilateral pleural effusions Repeat chest x-ray.  Follow.  8.  Pressure ulcer, POA Wound care consult.  9.  Abdominal aortic aneurysm Incidental finding on CT scan.  Will likely need close outpatient follow-up.   DVT prophylaxis: Heparin Code Status: Full Family Communication: Updated daughter via telephone. Disposition Plan: To be determined.   Consultants:   Cardiology attending  Procedures:   2D echo 02/16/2019-pending  Antimicrobials:   IV Rocephin 02/16/2019   Subjective: Patient alert.  Eating breakfast.  Patient with some difficulty expressing what she wants to say per patient.  Denies any chest pain.  Denies any shortness  of breath however speaking in some choppy sentences.  Denies any abdominal pain.  Alert to self place and time.  Heart rate noted to be ranging from the 110s to the 140s on telemetry.  Per RN due to low blood pressure patient had to be given a 500 cc bolus of normal saline early this morning.  Patient currently on Cardizem drip at 7.5 mg/h.  Objective: Vitals:   02/16/19 0401 02/16/19 0500 02/16/19 0530 02/16/19 0600  BP:  (!) 98/55 (!) 118/59 (!) 120/58  Pulse: 82 (!) 35 69 (!) 114  Resp: 18 12 12 12   Temp: 98 F (36.7 C)     TempSrc: Oral     SpO2: 93% 100% 100% 96%  Weight:      Height:        Intake/Output Summary (Last 24 hours) at 02/16/2019 0956 Last data filed at 02/16/2019 0600 Gross per 24 hour  Intake 787.84 ml  Output 250 ml  Net 537.84 ml   Filed Weights   02/16/19 0000  Weight: 54 kg    Examination:  General exam: Appears calm and  comfortable  HEENT: Thyroid mass .  Oropharynx is clear. Respiratory system: Some coarse scattered breath sounds.  Some choppy sentences.  No wheezing.  No crackles noted.  Some decreased breath sounds in the bases.  Cardiovascular system: Irregularly irregular.  No JVD, no murmurs, no lower extremity edema.  Gastrointestinal system: Abdomen is nondistended, soft and nontender. No organomegaly or masses felt. Normal bowel sounds heard. Central nervous system: Alert and oriented to self place and time.  Patient with some difficulty expressing words.. No focal neurological deficits. Extremities: Symmetric 5 x 5 power. Skin: No rashes, lesions or ulcers Psychiatry: Judgement and insight appear normal. Mood & affect appropriate.     Data Reviewed: I have personally reviewed following labs and imaging studies  CBC: Recent Labs  Lab 02/16/19 0047  WBC 9.7  HGB 11.4*  HCT 39.8  MCV 83.1  PLT 956   Basic Metabolic Panel: Recent Labs  Lab 02/16/19 0047  NA 136  K 4.1  CL 97*  CO2 26  GLUCOSE 124*  BUN 49*  CREATININE 1.71*    CALCIUM 8.8*  MG 2.1   GFR: Estimated Creatinine Clearance: 20.4 mL/min (A) (by C-G formula based on SCr of 1.71 mg/dL (H)). Liver Function Tests: Recent Labs  Lab 02/16/19 0047  AST 62*  ALT 152*  ALKPHOS 120  BILITOT 1.4*  PROT 6.3*  ALBUMIN 3.6   No results for input(s): LIPASE, AMYLASE in the last 168 hours. No results for input(s): AMMONIA in the last 168 hours. Coagulation Profile: Recent Labs  Lab 02/16/19 0046  INR 1.6*   Cardiac Enzymes: No results for input(s): CKTOTAL, CKMB, CKMBINDEX, TROPONINI in the last 168 hours. BNP (last 3 results) No results for input(s): PROBNP in the last 8760 hours. HbA1C: No results for input(s): HGBA1C in the last 72 hours. CBG: No results for input(s): GLUCAP in the last 168 hours. Lipid Profile: No results for input(s): CHOL, HDL, LDLCALC, TRIG, CHOLHDL, LDLDIRECT in the last 72 hours. Thyroid Function Tests: No results for input(s): TSH, T4TOTAL, FREET4, T3FREE, THYROIDAB in the last 72 hours. Anemia Panel: No results for input(s): VITAMINB12, FOLATE, FERRITIN, TIBC, IRON, RETICCTPCT in the last 72 hours. Sepsis Labs: No results for input(s): PROCALCITON, LATICACIDVEN in the last 168 hours.  Recent Results (from the past 240 hour(s))  MRSA PCR Screening     Status: None   Collection Time: 02/15/19 11:43 PM  Result Value Ref Range Status   MRSA by PCR NEGATIVE NEGATIVE Final    Comment:        The GeneXpert MRSA Assay (FDA approved for NASAL specimens only), is one component of a comprehensive MRSA colonization surveillance program. It is not intended to diagnose MRSA infection nor to guide or monitor treatment for MRSA infections. Performed at [facility], [place] [address] [place], [place] [address]          Radiology Studies: No results found.      Scheduled Meds:  Chlorhexidine Gluconate Cloth  6 each Topical Daily   mouth rinse  15 mL Mouth Rinse BID   Continuous  Infusions:  sodium chloride 50 mL/hr at 02/16/19 0600   cefTRIAXone (ROCEPHIN)  IV     diltiazem (CARDIZEM) infusion 5 mg/hr (02/16/19 0600)   heparin 800 Units/hr (02/16/19 0600)     LOS: 1 day    Time spent: 40 minutes.  No charge.    Irine Seal, MD [facility]  If 7PM-7AM, please contact night-coverage [email or web address] 02/16/2019, 9:56 AM

## 2019-02-16 NOTE — Progress Notes (Signed)
MD aware of current BP and HR.

## 2019-02-16 NOTE — Consult Note (Addendum)
[place]  [phone] Fax:(336) [phone]   MEDICAL ONCOLOGY - INITIAL CONSULTATION  Referral MD: Dr. 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 She went home and was living with her daughter who now brought her back to [place] with more confusion dementia and generalized weakness. Patient was found to have atrial fibrillation with rapid ventricular response. Initially given Cardizem that decreased the heart rate. She was also found to have oxygen saturation of 89% on room air which later improved to 97% on 2 L. She was reported to have had intermittent cough. As part of her evaluation patient had head CT without contrast that showed atrophy and chronic ischemia.  A 15 mm lytic lesion left parietal bone indeterminate. She also had CT chest with contrast that showed a moderate mass-effect on and narrowing of the trachea at the thoracic inlet due to enlarged and abnormal right thyroid lobe (greater than 7 cm) which possibly invades adjacent soft tissueplanes.  There was no regional lymphadenopathy or definite metastatic disease.  Patient also had small to moderate bilateral pleural effusions with lower lobe atelectasis/consolidation and underlying emphysema.  The patient was transferred to [facility] for atrial fibrillation with RVR.  Additional work-up shows a normal thyroid panel to date an elevated LDH at 681.  She also has an elevated creatinine of 1.6 and BUN 53.  When seen today, the patient has some difficulty answering questions.  She seems to have some expressive aphasia.  She was able to  answer yes/no questions.  She states that she has been having weakness in her legs.  Denies difficulty swallowing.  Reports that she eats well and has not lost weight.  Denies chest discomfort.  Reports mild shortness of breath.  Denies abdominal pain, nausea, vomiting, constipation, diarrhea.  Denies bleeding.  Outside [facility] from [facility] have been reviewed.  I talked with the patient's daughter by telephone.  The patient's daughter states that the mass in her thyroid has been present for about 3 years.  According to the daughter, she has been told previously that if it was not bothering her in terms of making it difficult for her to swallow or breathe, that no intervention was recommended.  The daughter is not completely sure if she is ever had a biopsy of this lesion before but thinks that they have been told it is a goiter.  Oncology was asked to see the patient to make recommendations regarding her thyroid mass and lytic lesion.   Past Medical History:  Diagnosis Date  . Asthma   . Atrial fibrillation ([place])   . Chronic kidney disease   . COPD (chronic obstructive pulmonary disease) ([place])   . Coronary artery disease   . Dementia ([place])   . Hypertension   . Stroke [facility])   :  Past Surgical History:  Procedure Laterality Date  . APPENDECTOMY    . Cardiac stents x 4    . right elbow surgery    :  Current Facility-Administered Medications  Medication Dose Route Frequency Provider Last Rate Last Dose  . 0.45 % sodium chloride infusion   Intravenous Continuous Gala Romney L, MD 50 mL/hr at 02/16/19 0600    .  cefTRIAXone (ROCEPHIN) 1 g in sodium chloride 0.9 % 100 mL IVPB  1 g Intravenous Q24H Eugenie Filler, MD 200 mL/hr at 02/16/19 1449 1 g at 02/16/19 1449  . Chlorhexidine Gluconate Cloth 2 % PADS 6 each  6 each Topical Daily Ivor Costa, MD      . diltiazem (CARDIZEM) 100 mg in dextrose 5 % 100 mL (1 mg/mL) infusion  5-15 mg/hr Intravenous Titrated Gala Romney L, MD  7.5 mL/hr at 02/16/19 1450 7.5 mg/hr at 02/16/19 1450  . heparin ADULT infusion 100 units/mL (25000 units/251m sodium chloride 0.45%)  700 Units/hr Intravenous Continuous PLynelle Doctor RPH 7 mL/hr at 02/16/19 1236 700 Units/hr at 02/16/19 1236  . MEDLINE mouth rinse  15 mL Mouth Rinse BID TEugenie Filler MD   15 mL at 02/16/19 1450  . metoprolol tartrate (LOPRESSOR) tablet 12.5 mg  12.5 mg Oral Q6H CBuford Dresser MD   12.5 mg at 02/16/19 1421  . ondansetron (ZOFRAN) tablet 4 mg  4 mg Oral Q6H PRN GElwyn Reach MD       Or  . ondansetron (ZOFRAN) injection 4 mg  4 mg Intravenous Q6H PRN GElwyn Reach MD         Allergies  Allergen Reactions  . Amiodarone Other (See Comments)    Respiratory issues, hair loss  :  History reviewed. No pertinent family history.:  Social History   Socioeconomic History  . Marital status: Widowed    Spouse name: Not on file  . Number of children: Not on file  . Years of education: Not on file  . Highest education level: Not on file  Occupational History  . Not on file  Social Needs  . Financial resource strain: Not on file  . Food insecurity:    Worry: Not on file    Inability: Not on file  . Transportation needs:    Medical: Not on file    Non-medical: Not on file  Tobacco Use  . Smoking status: Never Smoker  . Smokeless tobacco: Never Used  Substance and Sexual Activity  . Alcohol use: Not on file  . Drug use: Not on file  . Sexual activity: Not on file  Lifestyle  . Physical activity:    Days per week: Not on file    Minutes per session: Not on file  . Stress: Not on file  Relationships  . Social connections:    Talks on phone: Not on file    Gets together: Not on file    Attends religious service: Not on file    Active member of club or organization: Not on file    Attends meetings of clubs or organizations: Not on file    Relationship status: Not on file  . Intimate partner violence:    Fear of current or  ex partner: Not on file    Emotionally abused: Not on file    Physically abused: Not on file    Forced sexual activity: Not on file  Other Topics Concern  . Not on file  Social History Narrative  . Not on file  : ROS: The rest of the 14-point review of systems was negative.  Exam: Patient Vitals for the past 24 hrs:  BP Temp Temp src Pulse Resp SpO2 Height Weight  02/16/19 1421 117/72 - - (!) 105 - - - -  02/16/19 1300 - 97.9 F (36.6 C) Oral - - - - -  02/16/19 0800 - 98.1 F (36.7 C)  Oral - - - - -  02/16/19 0600 (!) 120/58 - - (!) 114 12 96 % - -  02/16/19 0530 (!) 118/59 - - 69 12 100 % - -  02/16/19 0500 (!) 98/55 - - (!) 35 12 100 % - -  02/16/19 0401 - 98 F (36.7 C) Oral 82 18 93 % - -  02/16/19 0400 (!) 103/58 - - - 19 - - -  02/16/19 0359 - - - 93 (!) 25 93 % - -  02/16/19 0356 (!) 93/55 - - - 19 - - -  02/16/19 0320 112/67 - - 78 20 96 % - -  02/16/19 0304 110/70 - - 98 17 90 % - -  02/16/19 0300 (!) 85/69 - - (!) 51 18 91 % - -  02/16/19 0200 (!) 97/58 - - 100 17 98 % - -  02/16/19 0132 103/62 - - - - - - -  02/16/19 0130 (!) 104/56 - - - - - - -  02/16/19 0115 107/60 - - - - - - -  02/16/19 0100 (!) 104/47 - - (!) 55 20 91 % - -  02/16/19 0008 - 97.8 F (36.6 C) Oral - - - - -  02/16/19 0000 104/70 - - 100 (!) 24 93 % '5\' 2"'$  (1.575 m) 119 lb 0.8 oz (54 kg)    General: Awake and alert, no distress..  Eyes:  no scleral icterus.  ENT:  There were no oropharyngeal lesions.  Neck visibly enlarged thyroid mass.  No tenderness with palpation.  Lymphatics:  Negative cervical, supraclavicular or axillary adenopathy.  Respiratory: Basal crackles to the lung bases bilaterally.  Cardiovascular: Irregularly irregular.  There was no pedal edema.  GI:  abdomen was soft, flat, nontender, nondistended, without organomegaly.  Muscoloskeletal:  no spinal tenderness of palpation of vertebral spine.  Skin exam was without echymosis, petichae.  Neuro exam was nonfocal.  The patient has  expressive aphasia.  Attention was good.    Lab Results  Component Value Date   WBC 9.7 02/16/2019   HGB 11.4 (L) 02/16/2019   HCT 39.8 02/16/2019   PLT 175 02/16/2019   GLUCOSE 124 (H) 02/16/2019   ALT 152 (H) 02/16/2019   AST 62 (H) 02/16/2019   NA 136 02/16/2019   K 4.1 02/16/2019   CL 97 (L) 02/16/2019   CREATININE 1.71 (H) 02/16/2019   BUN 49 (H) 02/16/2019   CO2 26 02/16/2019    Dg Bone Survey Met  Result Date: 02/16/2019 CLINICAL DATA:  Lytic lesion of bone on prior x-ray EXAM: METASTATIC BONE SURVEY COMPARISON:  None Correlation CT head and CT chest 02/15/2019, chest radiograph 02/15/2019 FINDINGS: Enlargement of cardiac silhouette. Tracheal deviation RIGHT to LEFT by large RIGHT thyroid mass. Probable mild pulmonary edema with small BILATERAL small pleural effusions and minimal basilar atelectasis. Extensive atherosclerotic calcifications aorta. Diffuse osseous demineralization. Lytic lesion LEFT parietal calvarium 19 x 14 mm. Compression fractures are identified at T12, T9 T4 and T3, question insufficiency fractures of underlying pathologic lesions are not excluded. Questionable tiny lytic bone lesion versus artifact at mid LEFT femoral diaphysis. Old healed fracture mid LEFT fibular and distal LEFT radial fractures. Prior RIGHT olecranon ORIF. No additional lytic or sclerotic bone lesions identified. IMPRESSION: Lytic lesion LEFT parietal calvarium question lytic metastasis versus multiple myeloma. Multiple thoracic spine compression fractures which could be insufficiency fractures no underlying pathologic lesions are not excluded, consider MR assessment. Questionable tiny lytic lesion versus artifact at the mid  LEFT femoral diaphysis. CHF with small bibasilar pleural effusions and basilar atelectasis. RIGHT thyroid mass with tracheal deviation RIGHT to LEFT. Enlargement of cardiac silhouette. Electronically Signed   By: Lavonia Dana M.D.   On: 02/16/2019 12:45   Dg Bone Survey  Met  Result Date: 02/16/2019 CLINICAL DATA:  Lytic lesion of bone on prior x-ray EXAM: METASTATIC BONE SURVEY COMPARISON:  None Correlation CT head and CT chest 02/15/2019, chest radiograph 02/15/2019 FINDINGS: Enlargement of cardiac silhouette. Tracheal deviation RIGHT to LEFT by large RIGHT thyroid mass. Probable mild pulmonary edema with small BILATERAL small pleural effusions and minimal basilar atelectasis. Extensive atherosclerotic calcifications aorta. Diffuse osseous demineralization. Lytic lesion LEFT parietal calvarium 19 x 14 mm. Compression fractures are identified at T12, T9 T4 and T3, question insufficiency fractures of underlying pathologic lesions are not excluded. Questionable tiny lytic bone lesion versus artifact at mid LEFT femoral diaphysis. Old healed fracture mid LEFT fibular and distal LEFT radial fractures. Prior RIGHT olecranon ORIF. No additional lytic or sclerotic bone lesions identified. IMPRESSION: Lytic lesion LEFT parietal calvarium question lytic metastasis versus multiple myeloma. Multiple thoracic spine compression fractures which could be insufficiency fractures no underlying pathologic lesions are not excluded, consider MR assessment. Questionable tiny lytic lesion versus artifact at the mid LEFT femoral diaphysis. CHF with small bibasilar pleural effusions and basilar atelectasis. RIGHT thyroid mass with tracheal deviation RIGHT to LEFT. Enlargement of cardiac silhouette. Electronically Signed   By: Lavonia Dana M.D.   On: 02/16/2019 12:45    Assessment and Plan: This is an 83 year old female admitted for A. fib with RVR.  Outside imaging showed a lytic lesion in her left parietal bone which is indeterminate and an enlarged thyroid causing mass-effect on the trachea.  Bone scan performed here today confirms the lytic lesion in the left parietal area.  -Recommend biopsy of her thyroid nodule. -Recommend CT of the abdomen and pelvis to evaluate for metastatic  disease. -Work-up for myeloma has already been ordered including serum protein electrophoresis, quantitative immunoglobulins, and kappa/lambda light chains.  Will await the results of the work-up outlined above.  If the thyroid biopsy shows that it is thyroid cancer, she would need to be seen by general surgeon.  Thank you for this referral.   Mikey Bussing, DNP, AGPCNP-BC, AOCNP  Attending Note  I personally saw the patient, reviewed the chart and examined the patient. The plan of care was discussed with the patient   I agree with the assessment and plan as documented above.  Lytic lesion on the skull: Differential diagnosis is multiple myeloma versus metastatic cancer versus benign bone changes. We obtained serum protein electrophoresis along with quantitative immunoglobulins and kappa lambda ratio. Patient has multiple compression fractures in the back most likely due to osteoporosis.  Given her elderly age and frail status, the purpose of this work-up is to make sure we get the correct diagnosis.  I do not think that she will be able to tolerate any aggressive treatment options.  Large thyroid mass needs to be biopsied. Thank you very much for the consultation.

## 2019-02-16 NOTE — Progress Notes (Signed)
83 year old female transferred from [place], arrived via transport 02/15/19 at 2330. Patient arrived alone. Admitting MD notified by charge nurse. Alert and oriented x4, able to participate in admission assessment and history. History of precious CVA with mild aphasia noted. Generalized weakness noted. Denied pain and/or shortness of breath. Arrived on 4L O2 via [place], transport personnel reported 100% during transport. Skin assessment completed and witnessed by fellow RN, Lonn Georgia. CHG bath completed. Foam applied to sacrum. MRSA swab to nares per protocol completed and sent to the lab. Tele monitoring notified and verified, afib 117. Purewick initiated for urinary incontinence, patient verbalized understanding of purewick use. ELink notified.

## 2019-02-17 ENCOUNTER — Other Ambulatory Visit: Payer: Self-pay

## 2019-02-17 ENCOUNTER — Inpatient Hospital Stay (HOSPITAL_COMMUNITY): Payer: Medicare Other

## 2019-02-17 DIAGNOSIS — I480 Paroxysmal atrial fibrillation: Principal | ICD-10-CM

## 2019-02-17 DIAGNOSIS — N189 Chronic kidney disease, unspecified: Secondary | ICD-10-CM

## 2019-02-17 DIAGNOSIS — Z888 Allergy status to other drugs, medicaments and biological substances status: Secondary | ICD-10-CM

## 2019-02-17 DIAGNOSIS — M899 Disorder of bone, unspecified: Secondary | ICD-10-CM

## 2019-02-17 DIAGNOSIS — I129 Hypertensive chronic kidney disease with stage 1 through stage 4 chronic kidney disease, or unspecified chronic kidney disease: Secondary | ICD-10-CM

## 2019-02-17 DIAGNOSIS — E041 Nontoxic single thyroid nodule: Secondary | ICD-10-CM

## 2019-02-17 DIAGNOSIS — J90 Pleural effusion, not elsewhere classified: Secondary | ICD-10-CM

## 2019-02-17 DIAGNOSIS — I69320 Aphasia following cerebral infarction: Secondary | ICD-10-CM

## 2019-02-17 DIAGNOSIS — J439 Emphysema, unspecified: Secondary | ICD-10-CM

## 2019-02-17 DIAGNOSIS — Z515 Encounter for palliative care: Secondary | ICD-10-CM

## 2019-02-17 DIAGNOSIS — E079 Disorder of thyroid, unspecified: Secondary | ICD-10-CM

## 2019-02-17 DIAGNOSIS — Z7189 Other specified counseling: Secondary | ICD-10-CM

## 2019-02-17 DIAGNOSIS — F0390 Unspecified dementia without behavioral disturbance: Secondary | ICD-10-CM

## 2019-02-17 LAB — KAPPA/LAMBDA LIGHT CHAINS
Kappa free light chain: 16.3 mg/L (ref 3.3–19.4)
Kappa, lambda light chain ratio: 0.66 (ref 0.26–1.65)
Lambda free light chains: 24.6 mg/L (ref 5.7–26.3)

## 2019-02-17 LAB — PROTEIN ELECTROPHORESIS, SERUM
A/G Ratio: 1.5 (ref 0.7–1.7)
Albumin ELP: 2.9 g/dL (ref 2.9–4.4)
Alpha-1-Globulin: 0.2 g/dL (ref 0.0–0.4)
Alpha-2-Globulin: 0.5 g/dL (ref 0.4–1.0)
Beta Globulin: 0.6 g/dL — ABNORMAL LOW (ref 0.7–1.3)
Gamma Globulin: 0.5 g/dL (ref 0.4–1.8)
Globulin, Total: 1.9 g/dL — ABNORMAL LOW (ref 2.2–3.9)
Total Protein ELP: 4.8 g/dL — ABNORMAL LOW (ref 6.0–8.5)

## 2019-02-17 LAB — BASIC METABOLIC PANEL
Anion gap: 11 (ref 5–15)
BUN: 46 mg/dL — ABNORMAL HIGH (ref 8–23)
CO2: 25 mmol/L (ref 22–32)
Calcium: 8.3 mg/dL — ABNORMAL LOW (ref 8.9–10.3)
Chloride: 100 mmol/L (ref 98–111)
Creatinine, Ser: 1.36 mg/dL — ABNORMAL HIGH (ref 0.44–1.00)
GFR calc Af Amer: 42 mL/min — ABNORMAL LOW (ref >60)
GFR calc non Af Amer: 36 mL/min — ABNORMAL LOW (ref >60)
Glucose, Bld: 119 mg/dL — ABNORMAL HIGH (ref 70–99)
Potassium: 3.2 mmol/L — ABNORMAL LOW (ref 3.5–5.1)
Sodium: 136 mmol/L (ref 135–145)

## 2019-02-17 LAB — CBC
HCT: 36.7 % (ref 36.0–46.0)
Hemoglobin: 10.6 g/dL — ABNORMAL LOW (ref 12.0–15.0)
MCH: 23.9 pg — ABNORMAL LOW (ref 26.0–34.0)
MCHC: 28.9 g/dL — ABNORMAL LOW (ref 30.0–36.0)
MCV: 82.7 fL (ref 80.0–100.0)
Platelets: 151 10*3/uL (ref 150–400)
RBC: 4.44 MIL/uL (ref 3.87–5.11)
RDW: 21.6 % — ABNORMAL HIGH (ref 11.5–15.5)
WBC: 7.4 10*3/uL (ref 4.0–10.5)
nRBC: 0.4 % — ABNORMAL HIGH (ref 0.0–0.2)

## 2019-02-17 LAB — HEPARIN LEVEL (UNFRACTIONATED): Heparin Unfractionated: 0.52 IU/mL (ref 0.30–0.70)

## 2019-02-17 LAB — URINE CULTURE

## 2019-02-17 LAB — IMMUNOFIXATION ELECTROPHORESIS
IgA: 226 mg/dL (ref 64–422)
IgG (Immunoglobin G), Serum: 686 mg/dL (ref 586–1602)
IgM (Immunoglobulin M), Srm: 121 mg/dL (ref 26–217)
Total Protein ELP: 5.5 g/dL — ABNORMAL LOW (ref 6.0–8.5)

## 2019-02-17 MED ORDER — MOMETASONE FURO-FORMOTEROL FUM 100-5 MCG/ACT IN AERO
2.0000 | INHALATION_SPRAY | Freq: Two times a day (BID) | RESPIRATORY_TRACT | Status: DC
  Administered 2019-02-17 – 2019-02-28 (×22): 2 via RESPIRATORY_TRACT
  Filled 2019-02-17: qty 8.8

## 2019-02-17 MED ORDER — POTASSIUM CHLORIDE CRYS ER 20 MEQ PO TBCR
40.0000 meq | EXTENDED_RELEASE_TABLET | Freq: Once | ORAL | Status: AC
  Administered 2019-02-17: 08:00:00 40 meq via ORAL
  Filled 2019-02-17: qty 2

## 2019-02-17 MED ORDER — DILTIAZEM HCL ER COATED BEADS 120 MG PO CP24
120.0000 mg | ORAL_CAPSULE | Freq: Every day | ORAL | Status: DC
  Administered 2019-02-17 – 2019-02-18 (×2): 120 mg via ORAL
  Filled 2019-02-17 (×2): qty 1

## 2019-02-17 NOTE — Progress Notes (Addendum)
PROGRESS NOTE    Carla Kemp  QQV:[id number] DOB: 1936-03-23 DOA: 02/15/2019 PCP: [name], MD    Brief Narrative:  HPI per Dr. Larene Pickett Mashaw is a 83 y.o. female with medical history significant of recent UTI with altered mental status, paroxysmal atrial fibrillation, asthma, history of goiter, COPD or asthma, who was recently discharged from the hospital after altered mental status with UTI.  She went home and was living with her daughter who now brought her back to [place] with more confusion dementia and generalized weakness.  Patient was found to have atrial fibrillation with rapid ventricular response.  Initially given Cardizem that decrease the heart rate.  She was also found to have oxygen saturation of 89% on room air which later improved to 97% on 2 L.  She was reported to have had intermittent cough.  As part of her evaluation patient had head CT without contrast that showed small 50 mm left parietal lytic bone lesions.  She also had CT chest that showed small to moderate bilateral pleural effusions and right lower lobe consolidation versus atelectasis.  Further find of 30 mm of infrarenal abdominal aneurysm and multiple thoracic compression fractures age indeterminate wide found.  She had a chest x-ray that showed bilateral pleural effusion and possible right mass versus atelectasis.  On the CT chest that was moderate mass-effect on the narrowing of the thoracic inlet trachea with a large abnormal thyroid lobe or mass which was greater than 7 cm invading adjacent soft tissue.  At this point there is suspicion for right-sided goiter versus malignancy.  With the patient's lytic bones thyroid cancer was suspected.  She is confused but airway is protected.  She is not in any respiratory distress at the moment.  COVID-19 test was done which was negative.  Thyroid panel so far negative LDH 681.  She has a creatinine 1.6 with BUN 53 otherwise unknown if this is  chronic.  She has normal white count.  Patient was transferred here with atrial fibrillation and rapid ventricular response.  Suspicion for malignancy so she could have oncology consultation..    Assessment & Plan:   Principal Problem:   A-fib ([place]) Active Problems:   Asthma   Acute lower UTI   Hypoxia   Pleural effusion, bilateral   Goiter   AAA (abdominal aortic aneurysm) (HCC)   Compression fracture of body of thoracic vertebra (HCC)   Thyroid mass   Altered mental status   Pressure injury of skin  1 paroxysmal A. fib with RVR  Patient noted to have a history of paroxysmal A. fib on oral Cardizem and Xarelto status post cardioversion x2 per daughter.  Patient presented A. fib with RVR with some confusion and generalized weakness per admitting H&P.  Patient on a Cardizem drip however blood pressure noted to be borderline with a bout of hypotension early the morning of 02/16/2019,  requiring a 500 cc bolus of normal saline per RN.  Heart rates ranging from the 100s to the 140s.  EKG consistent with A. fib.  Magnesium at 2.1.  Potassium at 4.1.  Troponin negative.  TSH and free T4 within normal limits.  2D echo with EF of 50 to 55%, no clear wall motion abnormalities however difficult imaging, moderately dilated left atrial size, grossly normal mitral valve and tricuspid valve and aortic valve.  Patient was placed on Lopressor however dose not given overnight and still on Cardizem drip.  Continue IV heparin for anticoagulation.  It was noted  that patient was on Xarelto prior to admission which we will hold off on resuming for now just in case patient may need some procedures.  Cardiology has been consulted and are following and appreciate input and recommendations.   2.  Thyroid mass versus large goiter/Lytic lesions on CT head CT with some pressure on the thoracic outlet.  Patient also noted to have what appeared to be lytic lesions and compression fractures from outside scans.  Patient  denies any shortness of breath.  Patient denies any difficulty swallowing.  TSH is 4.301.  Free T4 of 0.87.  Patient does state that she may have had a biopsy done on this thyroid nodule however will need to discuss with patient's family.  Unable to find any information on epic.  Patient also noted to have difficulty expressing her words and sentences which is chronic per daughter.  Per daughter patient recently hospitalized noted to be anemic at that time requiring transfusion of 2 units packed red blood cells however no overt bleeding.  Will get UPEP/SPEP, skeletal survey.  Daughter also states that patient has had this thyroid mass and states that as long as she is swallowing okay and without any difficulties breathing she did not want anything invasive done at this time in terms of the thyroid mass.  Patient has been seen in consultation by oncology and due to mass-effect on trachea oncology recommending biopsy.  We will get a thyroid ultrasound.  Will discuss with daughter as patient seems somewhat hesitant and may need to consult with IR for biopsy for further evaluation and management.  Myeloma work-up of SPEP, quantitative immunoglobulins, UPEP pending.  We will also consulted palliative care for goals of care.  Oncology following and appreciate input and recommendations.   3.  Acute encephalopathy On admission patient noted not to be able to give any adequate history.  Felt secondary to acute illness of underlying dementia.  Patient alert this morning.  Patient following commands appropriately.  Patient with an expressive aphasia from prior CVA.  Urinalysis worrisome for UTI.  Urine cultures with multiple species.  Continue empiric IV Rocephin D2/3. Continue supportive care.  4.  Hypoxia Felt likely secondary to asthma and COPD.  No evidence of pneumonia on x-rays.  Mass noted in chest appears to be thyroid mass.  Follow.  5.  Multiple thoracic compression fractures Age-indeterminate.  Supportive  care.  PT/OT.  6.  UTI Urinalysis worrisome for UTI.  Urine cultures with multiple species.  Patient however symptomatic with complaints of dysuria and burning when she wipes.  Continue IV Rocephin D2/3.  7.  Bilateral pleural effusions Follow.  8.  Pressure ulcer, POA Wound care consult.  9.  Abdominal aortic aneurysm Incidental finding on CT scan.  Will likely need close outpatient follow-up.  10.  Hypokalemia Replete.  11.  History of CVA with expressive aphasia Stable.  Patient was on Xarelto currently on IV heparin which we will continue for now.   DVT prophylaxis: Heparin Code Status: Full Family Communication: Tried to call daughter however went to answering machine.  Disposition Plan: Remain in stepdown unit.  To be determined.   Consultants:   Cardiology: Dr. Harrell Gave 02/16/2019  Oncology: Dr. Lindi Adie 02/16/2019  Procedures:   2D echo 02/16/2019  Thyroid ultrasound pending 02/17/2019  Skeletal survey 02/16/2019  Antimicrobials:   IV Rocephin 02/16/2019   Subjective: Patient sitting up alert.  States she is feeling better.  Denies any chest pain.  States breathing is close to her baseline.  Patient with expressive aphasia which is chronic.  Patient on Cardizem drip.  Metoprolol not given overnight.  Objective: Vitals:   02/17/19 0300 02/17/19 0400 02/17/19 0500 02/17/19 0800  BP: (!) 104/56 111/68  (!) 131/57  Pulse: (!) 46 95  91  Resp: (!) 25 18  (!) 25  Temp:  (!) 97.5 F (36.4 C)    TempSrc:  Oral    SpO2: 100% 100%  97%  Weight:   54.2 kg   Height:        Intake/Output Summary (Last 24 hours) at 02/17/2019 0940 Last data filed at 02/17/2019 0800 Gross per 24 hour  Intake 1118.67 ml  Output 680 ml  Net 438.67 ml   Filed Weights   02/16/19 0000 02/17/19 0500  Weight: 54 kg 54.2 kg    Examination:  General exam: NAD HEENT: Thyroid mass .  Oropharynx is clear. Respiratory system: Decreased breath sounds in the bases.  No wheezing.  No  crackles.  Normal respiratory effort. Cardiovascular system: Irregularly irregular.  No JVD, no murmurs, no lower extremity edema.  Gastrointestinal system: Abdomen is soft, nontender, nondistended, positive bowel sounds.  No rebound.  No guarding.  Central nervous system: Alert and oriented to self place and time.  Patient with an expressive aphasia which is chronic.  No focal neurological deficits. Extremities: Symmetric 5 x 5 power. Skin: No rashes, lesions or ulcers Psychiatry: Judgement and insight appear normal. Mood & affect appropriate.     Data Reviewed: I have personally reviewed following labs and imaging studies  CBC: Recent Labs  Lab 02/16/19 0047 02/17/19 0300  WBC 9.7 7.4  HGB 11.4* 10.6*  HCT 39.8 36.7  MCV 83.1 82.7  PLT 175 229   Basic Metabolic Panel: Recent Labs  Lab 02/16/19 0047 02/17/19 0300  NA 136 136  K 4.1 3.2*  CL 97* 100  CO2 26 25  GLUCOSE 124* 119*  BUN 49* 46*  CREATININE 1.71* 1.36*  CALCIUM 8.8* 8.3*  MG 2.1  --    GFR: Estimated Creatinine Clearance: 25.7 mL/min (A) (by C-G formula based on SCr of 1.36 mg/dL (H)). Liver Function Tests: Recent Labs  Lab 02/16/19 0047  AST 62*  ALT 152*  ALKPHOS 120  BILITOT 1.4*  PROT 6.3*  ALBUMIN 3.6   No results for input(s): LIPASE, AMYLASE in the last 168 hours. No results for input(s): AMMONIA in the last 168 hours. Coagulation Profile: Recent Labs  Lab 02/16/19 0046  INR 1.6*   Cardiac Enzymes: Recent Labs  Lab 02/16/19 1032  TROPONINI <0.03   BNP (last 3 results) No results for input(s): PROBNP in the last 8760 hours. HbA1C: No results for input(s): HGBA1C in the last 72 hours. CBG: No results for input(s): GLUCAP in the last 168 hours. Lipid Profile: No results for input(s): CHOL, HDL, LDLCALC, TRIG, CHOLHDL, LDLDIRECT in the last 72 hours. Thyroid Function Tests: Recent Labs    02/16/19 1012  TSH 4.301  FREET4 0.87   Anemia Panel: No results for input(s):  VITAMINB12, FOLATE, FERRITIN, TIBC, IRON, RETICCTPCT in the last 72 hours. Sepsis Labs: No results for input(s): PROCALCITON, LATICACIDVEN in the last 168 hours.  Recent Results (from the past 240 hour(s))  MRSA PCR Screening     Status: None   Collection Time: 02/15/19 11:43 PM  Result Value Ref Range Status   MRSA by PCR NEGATIVE NEGATIVE Final    Comment:        The GeneXpert MRSA Assay (FDA approved for  NASAL specimens only), is one component of a comprehensive MRSA colonization surveillance program. It is not intended to diagnose MRSA infection nor to guide or monitor treatment for MRSA infections. Performed at [facility], [place] [address] [place], [place] [address]   Culture, Urine     Status: Abnormal   Collection Time: 02/16/19  4:03 AM  Result Value Ref Range Status   Specimen Description   Final    URINE, CLEAN CATCH Performed at [facility], [place] [address], [place], [place] [address]    Special Requests   Final    NONE Performed at [facility], [place] [address] [place], [place] [address]    Culture MULTIPLE SPECIES PRESENT, SUGGEST RECOLLECTION (A)  Final   Report Status 02/17/2019 FINAL  Final         Radiology Studies: Dg Bone Survey Met  Result Date: 02/16/2019 CLINICAL DATA:  Lytic lesion of bone on prior x-ray EXAM: METASTATIC BONE SURVEY COMPARISON:  None Correlation CT head and CT chest 02/15/2019, chest radiograph 02/15/2019 FINDINGS: Enlargement of cardiac silhouette. Tracheal deviation RIGHT to LEFT by large RIGHT thyroid mass. Probable mild pulmonary edema with small BILATERAL small pleural effusions and minimal basilar atelectasis. Extensive atherosclerotic calcifications aorta. Diffuse osseous demineralization. Lytic lesion LEFT parietal calvarium 19 x 14 mm. Compression fractures are identified at T12, T9 T4 and T3, question insufficiency fractures of underlying pathologic lesions are not  excluded. Questionable tiny lytic bone lesion versus artifact at mid LEFT femoral diaphysis. Old healed fracture mid LEFT fibular and distal LEFT radial fractures. Prior RIGHT olecranon ORIF. No additional lytic or sclerotic bone lesions identified. IMPRESSION: Lytic lesion LEFT parietal calvarium question lytic metastasis versus multiple myeloma. Multiple thoracic spine compression fractures which could be insufficiency fractures no underlying pathologic lesions are not excluded, consider MR assessment. Questionable tiny lytic lesion versus artifact at the mid LEFT femoral diaphysis. CHF with small bibasilar pleural effusions and basilar atelectasis. RIGHT thyroid mass with tracheal deviation RIGHT to LEFT. Enlargement of cardiac silhouette. Electronically Signed   By: Lavonia Dana M.D.   On: 02/16/2019 12:45   US Thyroid  Result Date: 02/17/2019 CLINICAL DATA:  Thyroid mass, right EXAM: THYROID ULTRASOUND TECHNIQUE: Ultrasound examination of the thyroid gland and adjacent soft tissues was performed. COMPARISON:  CT 02/15/2019 FINDINGS: Parenchymal Echotexture: Normal (left); markedly heterogenous (right) Isthmus: 0.5 cm thickness Right lobe: 8 x 5.8 x 6.9 cm Left lobe: 5.1 x 2.5 x 1.8 cm _________________________________________________________ Estimated total number of nodules >/= 1 cm: 3 Number of spongiform nodules >/=  2 cm not described below (TR1): 0 Number of mixed cystic and solid nodules >/= 1.5 cm not described below (TR2): 0 _________________________________________________________ Right lobe is nearly completely replaced by complex mostly solid mass without calcifications; no appreciable normal right lobe thyroid tissue is evident Left findings: Nodule # 1: Location: Left; Superior Maximum size: 2.1 cm; Other 2 dimensions: 1.5 x 1.2 cm Composition: mixed cystic and solid (1) Echogenicity: isoechoic (1) Shape: not taller-than-wide (0) Margins: smooth (0) Echogenic foci: none (0) ACR TI-RADS total  points: 2. ACR TI-RADS risk category: TR2 (2 points). ACR TI-RADS recommendations: This nodule does NOT meet TI-RADS criteria for biopsy or dedicated follow-up. _________________________________________________________ Nodule # 2: Location: Left; Mid Maximum size: 2.4 cm; Other 2 dimensions: 1.9 x 1.7 cm Composition: cystic/almost completely cystic (0) This nodule does NOT meet TI-RADS criteria for biopsy or dedicated follow-up. _________________________________________________________ Nodule # 3: Location: Left; Inferior Maximum size: 1.2 cm;  Other 2 dimensions: 1 cm Composition: cystic/almost completely cystic (0) This nodule does NOT meet TI-RADS criteria for biopsy or dedicated follow-up. _________________________________________________________ IMPRESSION: 1. Thyroid mass replacing most of the right lobe. Recommend FNA biopsy. 2. Smaller cystic nodules in the left lobe, none of which meet criteria for biopsy or dedicated imaging follow-up. The above is in keeping with the ACR TI-RADS recommendations - J Am Coll Radiol 2017;14:587-595. Electronically Signed   By: Lucrezia Europe M.D.   On: 02/17/2019 09:32        Scheduled Meds:  Chlorhexidine Gluconate Cloth  6 each Topical Daily   mouth rinse  15 mL Mouth Rinse BID   metoprolol tartrate  12.5 mg Oral Q6H   Continuous Infusions:  sodium chloride 50 mL/hr at 02/16/19 0600   cefTRIAXone (ROCEPHIN)  IV Stopped (02/16/19 1519)   diltiazem (CARDIZEM) infusion 7.5 mg/hr (02/17/19 0800)   heparin 650 Units/hr (02/17/19 0800)     LOS: 2 days    Time spent: 40 minutes    Irine Seal, MD [facility]  If 7PM-7AM, please contact night-coverage [email or web address] 02/17/2019, 9:40 AM

## 2019-02-17 NOTE — Progress Notes (Signed)
ANTICOAGULATION CONSULT NOTE - Follow Up Consult  Pharmacy Consult for heparin Indication: atrial fibrillation with RVR  Allergies  Allergen Reactions  . Amiodarone Other (See Comments)    Respiratory issues, hair loss    Patient Measurements: Height: 5\' 2"  (157.5 cm) Weight: 119 lb 7.8 oz (54.2 kg) IBW/kg (Calculated) : 50.1 Heparin Dosing Weight: 54 kg  Vital Signs: Temp: 97.5 F (36.4 C) (04/30 0400) Temp Source: Oral (04/30 0400) BP: 131/57 (04/30 0800) Pulse Rate: 91 (04/30 0800)  Labs: Recent Labs    02/16/19 0046 02/16/19 0047 02/16/19 1012 02/16/19 1032 02/16/19 2053 02/16/19 2317 02/17/19 0300  HGB  --  11.4*  --   --   --   --  10.6*  HCT  --  39.8  --   --   --   --  36.7  PLT  --  175  --   --   --   --  151  APTT 31  --   --   --   --  82*  --   LABPROT 18.5*  --   --   --   --   --   --   INR 1.6*  --   --   --   --   --   --   HEPARINUNFRC  --   --  0.86*  --  0.71*  --  0.52  CREATININE  --  1.71*  --   --   --   --  1.36*  TROPONINI  --   --   --  <0.03  --   --   --     Estimated Creatinine Clearance: 25.7 mL/min (A) (by C-G formula based on SCr of 1.36 mg/dL (H)).   Assessment: Patient's an 83 y.o F transferred to [facility] on 4/29 for workup of mass noted on chest CT.  Heparin drip started on admission for afib with RVR.  Today, 02/17/2019: - heparin level is therapeutic at 0.52 - hgb down slightly to 10.6, plts 151K - no bleeding documented - INR 1.6, AST/ALT 62/152. Tbili 1.4  on 4/29 - Heme/onc recommends biopsy of thyroid nodule  Goal of Therapy:  Heparin level 0.3-0.7 units/ml Monitor platelets by anticoagulation protocol: Yes   Plan:  - continue heparin drip at  650 units/hr - daily heparin level - monitor for s/s bleeding - if to proceed with biopsy, please advise if/when heparin drip needs to be held for procedure.  Rosario Duey P 02/17/2019,8:44 AM

## 2019-02-17 NOTE — Progress Notes (Signed)
Progress Note  Patient Name: Carla Kemp Date of Encounter: 02/17/2019  Primary Cardiologist: [facility] in [place] in bed, awakens easily. Denies complaints in general except for fatigue. Denies chest pain. Feels like her breathing is unchanged.  Inpatient Medications    Scheduled Meds: . Chlorhexidine Gluconate Cloth  6 each Topical Daily  . diltiazem  120 mg Oral Daily  . mouth rinse  15 mL Mouth Rinse BID  . metoprolol tartrate  12.5 mg Oral Q6H  . mometasone-formoterol  2 puff Inhalation BID   Continuous Infusions: . sodium chloride 50 mL/hr at 02/16/19 0600  . cefTRIAXone (ROCEPHIN)  IV Stopped (02/16/19 1519)  . diltiazem (CARDIZEM) infusion 7.5 mg/hr (02/17/19 1100)  . heparin 650 Units/hr (02/17/19 1100)   PRN Meds: ondansetron **OR** ondansetron (ZOFRAN) IV   Vital Signs    Vitals:   02/17/19 0800 02/17/19 1000 02/17/19 1047 02/17/19 1100  BP: (!) 131/57 (!) 119/57  111/67  Pulse: 91 96  74  Resp: (!) 25 (!) 21  20  Temp: 97.8 F (36.6 C)     TempSrc: Oral     SpO2: 97% 98% 99% 95%  Weight:      Height:        Intake/Output Summary (Last 24 hours) at 02/17/2019 1129 Last data filed at 02/17/2019 1100 Gross per 24 hour  Intake 1160.66 ml  Output 680 ml  Net 480.66 ml   Last 3 Weights 02/17/2019 02/16/2019  Weight (lbs) 119 lb 7.8 oz 119 lb 0.8 oz  Weight (kg) 54.2 kg 54 kg      Telemetry    Atrial fib, most rates upper 90s/low 100s - Personally Reviewed  ECG    No new since yesterday- Personally Reviewed  Physical Exam   GEN: Sleeping in bed, awakens to voice, in NAD Neck: No JVD appreciated Cardiac: irregularly irregular, no murmurs appreciated. Respiratory: Clear to auscultation bilaterally at upper fields, diminished at bases GI: Soft, nontender, non-distended  MS: No edema; No deformity. Neuro:  Nonfocal  Psych: Normal affect   Labs    Chemistry Recent Labs  Lab 02/16/19 0047 02/17/19 0300  NA  136 136  K 4.1 3.2*  CL 97* 100  CO2 26 25  GLUCOSE 124* 119*  BUN 49* 46*  CREATININE 1.71* 1.36*  CALCIUM 8.8* 8.3*  PROT 6.3*  --   ALBUMIN 3.6  --   AST 62*  --   ALT 152*  --   ALKPHOS 120  --   BILITOT 1.4*  --   GFRNONAA 28* 36*  GFRAA 32* 42*  ANIONGAP 13 11     Hematology Recent Labs  Lab 02/16/19 0047 02/17/19 0300  WBC 9.7 7.4  RBC 4.79 4.44  HGB 11.4* 10.6*  HCT 39.8 36.7  MCV 83.1 82.7  MCH 23.8* 23.9*  MCHC 28.6* 28.9*  RDW 21.2* 21.6*  PLT 175 151    Cardiac Enzymes Recent Labs  Lab 02/16/19 1032  TROPONINI <0.03   No results for input(s): TROPIPOC in the last 168 hours.   BNPNo results for input(s): BNP, PROBNP in the last 168 hours.   DDimer No results for input(s): DDIMER in the last 168 hours.   Radiology    Dg Bone Survey Met  Result Date: 02/16/2019 CLINICAL DATA:  Lytic lesion of bone on prior x-ray EXAM: METASTATIC BONE SURVEY COMPARISON:  None Correlation CT head and CT chest 02/15/2019, chest radiograph 02/15/2019 FINDINGS: Enlargement of cardiac silhouette. Tracheal deviation RIGHT  to LEFT by large RIGHT thyroid mass. Probable mild pulmonary edema with small BILATERAL small pleural effusions and minimal basilar atelectasis. Extensive atherosclerotic calcifications aorta. Diffuse osseous demineralization. Lytic lesion LEFT parietal calvarium 19 x 14 mm. Compression fractures are identified at T12, T9 T4 and T3, question insufficiency fractures of underlying pathologic lesions are not excluded. Questionable tiny lytic bone lesion versus artifact at mid LEFT femoral diaphysis. Old healed fracture mid LEFT fibular and distal LEFT radial fractures. Prior RIGHT olecranon ORIF. No additional lytic or sclerotic bone lesions identified. IMPRESSION: Lytic lesion LEFT parietal calvarium question lytic metastasis versus multiple myeloma. Multiple thoracic spine compression fractures which could be insufficiency fractures no underlying pathologic lesions  are not excluded, consider MR assessment. Questionable tiny lytic lesion versus artifact at the mid LEFT femoral diaphysis. CHF with small bibasilar pleural effusions and basilar atelectasis. RIGHT thyroid mass with tracheal deviation RIGHT to LEFT. Enlargement of cardiac silhouette. Electronically Signed   By: Lavonia Dana M.D.   On: 02/16/2019 12:45   US Thyroid  Result Date: 02/17/2019 CLINICAL DATA:  Thyroid mass, right EXAM: THYROID ULTRASOUND TECHNIQUE: Ultrasound examination of the thyroid gland and adjacent soft tissues was performed. COMPARISON:  CT 02/15/2019 FINDINGS: Parenchymal Echotexture: Normal (left); markedly heterogenous (right) Isthmus: 0.5 cm thickness Right lobe: 8 x 5.8 x 6.9 cm Left lobe: 5.1 x 2.5 x 1.8 cm _________________________________________________________ Estimated total number of nodules >/= 1 cm: 3 Number of spongiform nodules >/=  2 cm not described below (TR1): 0 Number of mixed cystic and solid nodules >/= 1.5 cm not described below (TR2): 0 _________________________________________________________ Right lobe is nearly completely replaced by complex mostly solid mass without calcifications; no appreciable normal right lobe thyroid tissue is evident Left findings: Nodule # 1: Location: Left; Superior Maximum size: 2.1 cm; Other 2 dimensions: 1.5 x 1.2 cm Composition: mixed cystic and solid (1) Echogenicity: isoechoic (1) Shape: not taller-than-wide (0) Margins: smooth (0) Echogenic foci: none (0) ACR TI-RADS total points: 2. ACR TI-RADS risk category: TR2 (2 points). ACR TI-RADS recommendations: This nodule does NOT meet TI-RADS criteria for biopsy or dedicated follow-up. _________________________________________________________ Nodule # 2: Location: Left; Mid Maximum size: 2.4 cm; Other 2 dimensions: 1.9 x 1.7 cm Composition: cystic/almost completely cystic (0) This nodule does NOT meet TI-RADS criteria for biopsy or dedicated follow-up.  _________________________________________________________ Nodule # 3: Location: Left; Inferior Maximum size: 1.2 cm; Other 2 dimensions: 1 cm Composition: cystic/almost completely cystic (0) This nodule does NOT meet TI-RADS criteria for biopsy or dedicated follow-up. _________________________________________________________ IMPRESSION: 1. Thyroid mass replacing most of the right lobe. Recommend FNA biopsy. 2. Smaller cystic nodules in the left lobe, none of which meet criteria for biopsy or dedicated imaging follow-up. The above is in keeping with the ACR TI-RADS recommendations - J Am Coll Radiol 2017;14:587-595. Electronically Signed   By: Lucrezia Europe M.D.   On: 02/17/2019 09:32    Cardiac Studies   I personally read her echo this morning.   1. The left ventricle has low normal systolic function, with an ejection fraction of 50-55%. The cavity size was normal. Left ventricular diastolic function could not be evaluated secondary to atrial fibrillation. 2. Very difficult imaging, no clear WMA but sensitivity reduced based on limitations of the study. 3. Left atrial size was moderately dilated. 4. The mitral valve is grossly normal. 5. The tricuspid valve is grossly normal. Tricuspid valve regurgitation is moderate. 6. The aortic valve is grossly normal. Mild thickening of the aortic valve. Mild calcification of  the aortic valve. Aortic valve regurgitation is mild by color flow Doppler. 7. The inferior vena cava was dilated in size with <50% respiratory variability. 8. The interatrial septum was not assessed.  SUMMARY  Technically difficult study, even with use of echo contrast. In atrial fibrillation with HRs just above 100. With contrast imaging, appears to have low normal EF, but reduced sensitivity limits analysis.  Patient Profile     83 y.o. female with a hx of paroxysmal atrial fibrillation, asthma vs. COPD, recent hospitalization for for UTI with altered mental status who  is being followed for the evaluation of atrial fibrillation with rapid ventricular response at the request of Dr. Irine Seal.  Assessment & Plan    1. Atrial fibrillation with RVR -CHA2DS2/VAS Stroke Risk Points=4 at least, or 6 if history of CVA -reportedly on xarelto prior to admission. Would continue heparin given potential thyroid biopsy/workup. If no active bleeding and no plans for additional procedures, xarelto could be restarted after potential biopsy. Likely will need reduced dose (15 mg) based on renal function, when preparing to transition would ask pharmacy for assistance with timing/dosage -reported prior reaction to amiodarone, unclear what this was (per daughter, potentially lung toxicity/alopecia) -will start oral cardizem today and transition off of diltiazem drip. Would continue fractionated metoprolol as well for now to maximize HR control. If she stabilized off of drip, will try to consolidate to single medication for rate control. -given that she has a mediastinal mass with tracheal deviation, she is a high risk candidate for TEE, and she would need TEE prior to cardioversion as her anticoagulation history is unclear. Therefore, would not plan to cardiovert unless she becomes unstable. Would aim for rate control.  Additional cardiovascular concerns: -her echo images are very difficult. Without contrast, appears that her EF might be reduced, but with use of echo contrast appears it may be low normal function. She reportedly has a history of heart failure, unclear if this is systolic or diastolic -per notes, reportedly has history of cardiac cath/stents. Unknown location, dates, etc. Denies chest pain, initial troponin negative, ECG without acute ischemic changes -reported history of hypertension and hyperlipidemia. Has had some hypotension with RVR. -would be helpful if we could get records of cath, echo, medications, other testing from [facility] in [place]. Did not see  any in chart today.  For questions or updates, please contact [place] Please consult [email or web address] for contact info under     Signed, Buford Dresser, MD  02/17/2019, 11:29 AM

## 2019-02-17 NOTE — Consult Note (Signed)
Consultation Note Date: 02/17/2019   Patient Name: Carla Kemp  DOB: 11-26-1935  MRN: [id number]  Age / Sex: 83 y.o., female  PCP: [name], MD Referring Physician: Eugenie Filler, MD  Reason for Consultation: Establishing goals of care  HPI/Patient Profile: 83 y.o. female  with past medical history of HTN, a fib, CVA, and ?COPD admitted on 02/15/2019 with confusion and weakess. Was recently hospitalized for UTI and AMS. Found to have A fib RVR and UTI. CT head revealed small left parietal lytic bone lesion. CT chest revealed bilateral pleural effusions. CXR revealed possible right mass vs atelectasis. Thyroid US revealed thyroid mass replacing most of the right lobe - FNA biopsy has been recommended. Diff dx per oncology include multiple myeloma, metastatic cancer, or benign changes. Per oncology, unable to tolerate any aggressive treatment options. PMT consulted for [place].   Clinical Assessment and Goals of Care: I have reviewed medical records including EPIC notes, labs and imaging, received report from RN, and then spoke with patient's daughter, Nevin Bloodgood,  to discuss diagnosis prognosis, [place], EOL wishes, disposition and options.  I introduced Palliative Medicine as specialized medical care for people living with serious illness. It focuses on providing relief from the symptoms and stress of a serious illness. The goal is to improve quality of life for both the patient and the family.  Nevin Bloodgood shares that patient has lived with her for a year after a fall she had while living independently.  As far as functional and nutritional status, she tells me patient is able to care for herself but spends most of her time sedentary. Is on oxygen all the time at home. She has experienced some weight loss. Appetite fluctuates - overall has experienced decreased appetite.   We discussed her current illness and what it means in the larger context of  her on-going co-morbidities.  Natural disease trajectory and expectations at EOL were discussed. We discussed her heart problems and concern for cancer. Nevin Bloodgood expresses good understanding.  I attempted to elicit values and goals of care important to the patient.  She tells me patient would not be interested in any sort of aggressive interventions - quality of life is more important to her than quantity.   We discussed option of biopsy - Nevin Bloodgood tells me she is interested in this just to receive answers and prognosis. She knows her mother would not be interested in any sort of cancer treatment and understands she is not a good candidate either.   Advance directives, concepts specific to code status, artifical feeding and hydration, and rehospitalization were considered and discussed. We discussed her code status and Nevin Bloodgood shares the patient would want a natural death and would not want resuscitative attempts - discussed changing code status to DNR.  Questions and concerns were addressed. The family was encouraged to call with questions or concerns.   Primary Decision Maker NEXT OF KIN - daughter - Sherol Dade Patient is also able to participate in decision making but has been limited d/t lethargy, periods of confusion, and difficulty communicating d/t expressive aphasia   SUMMARY OF RECOMMENDATIONS   - code status changed to DNR per patient and daughter's wishes - discussed with Dr. Grandville Silos interest in biopsy - PMT will continue to follow - daughter also shares she is interested in PT evaluation and rehab if needed  Code Status/Advance Care Planning:  DNR   Symptom Management:   RN denies concerns - no pain/shortness of breath/nausea  Palliative Prophylaxis:   Aspiration, Delirium  Protocol, Frequent Pain Assessment and Turn Reposition  Additional Recommendations (Limitations, Scope, Preferences):  No Chemotherapy, No Radiation and No Surgical Procedures  Prognosis:   Unable to  determine  Discharge Planning: To Be Determined      Primary Diagnoses: Present on Admission: . A-fib ([place]) . Asthma . Acute lower UTI . Hypoxia . Pleural effusion, bilateral . Goiter . AAA (abdominal aortic aneurysm) ([place]) . Compression fracture of body of thoracic vertebra ([facility]) . Thyroid mass . Altered mental status   I have reviewed the medical record, interviewed the patient and family, and examined the patient. The following aspects are pertinent.  Past Medical History:  Diagnosis Date  . Asthma   . Atrial fibrillation ([place])   . Chronic kidney disease   . COPD (chronic obstructive pulmonary disease) ([place])   . Coronary artery disease   . Dementia ([place])   . Hypertension   . Stroke [facility])    Social History   Socioeconomic History  . Marital status: Widowed    Spouse name: Not on file  . Number of children: Not on file  . Years of education: Not on file  . Highest education level: Not on file  Occupational History  . Not on file  Social Needs  . Financial resource strain: Not on file  . Food insecurity:    Worry: Not on file    Inability: Not on file  . Transportation needs:    Medical: Not on file    Non-medical: Not on file  Tobacco Use  . Smoking status: Never Smoker  . Smokeless tobacco: Never Used  Substance and Sexual Activity  . Alcohol use: Not on file  . Drug use: Not on file  . Sexual activity: Not on file  Lifestyle  . Physical activity:    Days per week: Not on file    Minutes per session: Not on file  . Stress: Not on file  Relationships  . Social connections:    Talks on phone: Not on file    Gets together: Not on file    Attends religious service: Not on file    Active member of club or organization: Not on file    Attends meetings of clubs or organizations: Not on file    Relationship status: Not on file  Other Topics Concern  . Not on file  Social History Narrative  . Not on file   History reviewed. No pertinent family  history. Scheduled Meds: . Chlorhexidine Gluconate Cloth  6 each Topical Daily  . diltiazem  120 mg Oral Daily  . mouth rinse  15 mL Mouth Rinse BID  . metoprolol tartrate  12.5 mg Oral Q6H  . mometasone-formoterol  2 puff Inhalation BID   Continuous Infusions: . sodium chloride 50 mL/hr at 02/16/19 0600  . cefTRIAXone (ROCEPHIN)  IV 1 g (02/17/19 1413)  . diltiazem (CARDIZEM) infusion 5 mg/hr (02/17/19 1400)  . heparin 650 Units/hr (02/17/19 1400)   PRN Meds:.ondansetron **OR** ondansetron (ZOFRAN) IV Allergies  Allergen Reactions  . Amiodarone Other (See Comments)    Respiratory issues, hair loss   Vital Signs: BP 107/63   Pulse (!) 103   Temp (!) 97.5 F (36.4 C) (Oral)   Resp 17   Ht '5\' 2"'$  (1.575 m)   Wt 54.2 kg   SpO2 95%   BMI 21.85 kg/m  Pain Scale: 0-10   Pain Score: 0-No pain   SpO2: SpO2: 95 % O2 Device:SpO2: 95 % O2 Flow Rate: .O2 Flow  Rate (L/min): 2 L/min  IO: Intake/output summary:   Intake/Output Summary (Last 24 hours) at 02/17/2019 1659 Last data filed at 02/17/2019 1400 Gross per 24 hour  Intake 262.75 ml  Output 400 ml  Net -137.25 ml    LBM: Last BM Date: 02/17/19 Baseline Weight: Weight: 54 kg Most recent weight: Weight: 54.2 kg     Palliative Assessment/Data: PPS 40%    The above conversation was completed via telephone due to the visitor restrictions during the COVID-19 pandemic. Thorough chart review and discussion with necessary members of the care team was completed as part of assessment. All issues were discussed and addressed but no physical exam was performed.  Time Total: 70 minutes Greater than 50%  of this time was spent counseling and coordinating care related to the above assessment and plan.  Juel Burrow, DNP, AGNP-C Palliative Medicine Team [phone] Pager: [phone]

## 2019-02-18 ENCOUNTER — Inpatient Hospital Stay (HOSPITAL_COMMUNITY): Payer: Medicare Other

## 2019-02-18 DIAGNOSIS — Z9889 Other specified postprocedural states: Secondary | ICD-10-CM

## 2019-02-18 LAB — BASIC METABOLIC PANEL
Anion gap: 11 (ref 5–15)
BUN: 40 mg/dL — ABNORMAL HIGH (ref 8–23)
CO2: 22 mmol/L (ref 22–32)
Calcium: 8.3 mg/dL — ABNORMAL LOW (ref 8.9–10.3)
Chloride: 100 mmol/L (ref 98–111)
Creatinine, Ser: 1.14 mg/dL — ABNORMAL HIGH (ref 0.44–1.00)
GFR calc Af Amer: 52 mL/min — ABNORMAL LOW (ref >60)
GFR calc non Af Amer: 45 mL/min — ABNORMAL LOW (ref >60)
Glucose, Bld: 113 mg/dL — ABNORMAL HIGH (ref 70–99)
Potassium: 3.8 mmol/L (ref 3.5–5.1)
Sodium: 133 mmol/L — ABNORMAL LOW (ref 135–145)

## 2019-02-18 LAB — IMMUNOGLOBULINS A/E/G/M, SERUM
IgA: 202 mg/dL (ref 64–422)
IgE (Immunoglobulin E), Serum: 266 IU/mL (ref 6–495)
IgG (Immunoglobin G), Serum: 588 mg/dL (ref 586–1602)
IgM (Immunoglobulin M), Srm: 115 mg/dL (ref 26–217)

## 2019-02-18 LAB — CBC
HCT: 39.1 % (ref 36.0–46.0)
Hemoglobin: 10.6 g/dL — ABNORMAL LOW (ref 12.0–15.0)
MCH: 23 pg — ABNORMAL LOW (ref 26.0–34.0)
MCHC: 27.1 g/dL — ABNORMAL LOW (ref 30.0–36.0)
MCV: 84.8 fL (ref 80.0–100.0)
Platelets: 141 10*3/uL — ABNORMAL LOW (ref 150–400)
RBC: 4.61 MIL/uL (ref 3.87–5.11)
RDW: 21.4 % — ABNORMAL HIGH (ref 11.5–15.5)
WBC: 6.9 10*3/uL (ref 4.0–10.5)
nRBC: 0.3 % — ABNORMAL HIGH (ref 0.0–0.2)

## 2019-02-18 LAB — HEPARIN LEVEL (UNFRACTIONATED): Heparin Unfractionated: 0.3 IU/mL (ref 0.30–0.70)

## 2019-02-18 MED ORDER — DILTIAZEM HCL ER COATED BEADS 240 MG PO CP24
240.0000 mg | ORAL_CAPSULE | Freq: Every day | ORAL | Status: DC
  Administered 2019-02-19 – 2019-02-28 (×10): 240 mg via ORAL
  Filled 2019-02-18 (×7): qty 1
  Filled 2019-02-18: qty 2
  Filled 2019-02-18: qty 1
  Filled 2019-02-18: qty 2

## 2019-02-18 MED ORDER — HEPARIN (PORCINE) 25000 UT/250ML-% IV SOLN
700.0000 [IU]/h | INTRAVENOUS | Status: DC
  Administered 2019-02-18: 700 [IU]/h via INTRAVENOUS
  Filled 2019-02-18: qty 250

## 2019-02-18 MED ORDER — LIDOCAINE HCL 1 % IJ SOLN
INTRAMUSCULAR | Status: AC
  Filled 2019-02-18: qty 10

## 2019-02-18 MED ORDER — ACETAMINOPHEN 325 MG PO TABS
650.0000 mg | ORAL_TABLET | Freq: Four times a day (QID) | ORAL | Status: DC | PRN
  Administered 2019-02-18 – 2019-02-21 (×2): 650 mg via ORAL
  Filled 2019-02-18 (×2): qty 2

## 2019-02-18 MED ORDER — LIP MEDEX EX OINT
TOPICAL_OINTMENT | CUTANEOUS | Status: DC | PRN
  Administered 2019-02-18: 10:00:00 via TOPICAL
  Filled 2019-02-18: qty 7

## 2019-02-18 MED ORDER — DILTIAZEM HCL ER COATED BEADS 120 MG PO CP24: 120.0000 mg | ORAL_CAPSULE | Freq: Once | ORAL | Status: DC

## 2019-02-18 NOTE — Progress Notes (Signed)
PROGRESS NOTE    Carla Kemp  XLK:[id number] DOB: 12-20-1935 DOA: 02/15/2019 PCP: Carla Ready, MD    Brief Narrative:  HPI per Dr. Larene Pickett Geffert is a 83 y.o. female with medical history significant of recent UTI with altered mental status, paroxysmal atrial fibrillation, asthma, history of goiter, COPD or asthma, who was recently discharged from the hospital after altered mental status with UTI.  She went home and was living with her daughter who now brought her back to [place] with more confusion dementia and generalized weakness.  Patient was found to have atrial fibrillation with rapid ventricular response.  Initially given Cardizem that decrease the heart rate.  She was also found to have oxygen saturation of 89% on room air which later improved to 97% on 2 L.  She was reported to have had intermittent cough.  As part of her evaluation patient had head CT without contrast that showed small 50 mm left parietal lytic bone lesions.  She also had CT chest that showed small to moderate bilateral pleural effusions and right lower lobe consolidation versus atelectasis.  Further find of 30 mm of infrarenal abdominal aneurysm and multiple thoracic compression fractures age indeterminate wide found.  She had a chest x-ray that showed bilateral pleural effusion and possible right mass versus atelectasis.  On the CT chest that was moderate mass-effect on the narrowing of the thoracic inlet trachea with a large abnormal thyroid lobe or mass which was greater than 7 cm invading adjacent soft tissue.  At this point there is suspicion for right-sided goiter versus malignancy.  With the patient's lytic bones thyroid cancer was suspected.  She is confused but airway is protected.  She is not in any respiratory distress at the moment.  COVID-19 test was done which was negative.  Thyroid panel so far negative LDH 681.  She has a creatinine 1.6 with BUN 53 otherwise unknown if this is  chronic.  She has normal white count.  Patient was transferred here with atrial fibrillation and rapid ventricular response.  Suspicion for malignancy so she could have oncology consultation..    Assessment & Plan:   Principal Problem:   A-fib ([place]) Active Problems:   Asthma   Acute lower UTI   Hypoxia   Pleural effusion, bilateral   Goiter   AAA (abdominal aortic aneurysm) (HCC)   Compression fracture of body of thoracic vertebra (HCC)   Thyroid mass   Altered mental status   Pressure injury of skin   Lytic lesion of bone on x-ray   Goals of care, counseling/discussion   Palliative care by specialist  1 paroxysmal A. fib with RVR CHA2DS2VASC SCORE 6 Patient noted to have a history of paroxysmal A. fib on oral Cardizem and Xarelto status post cardioversion x2 per daughter.  Patient presented A. fib with RVR with some confusion and generalized weakness per admitting H&P.  Patient on a Cardizem drip however blood pressure noted to be borderline with a bout of hypotension early the morning of 02/16/2019,  requiring a 500 cc bolus of normal saline per RN.  Heart rates ranging from the 100s to the 120s.  EKG consistent with A. fib.  Magnesium at 2.1.  Potassium at 3.8.  Troponin negative.  TSH and free T4 within normal limits.  2D echo with EF of 50 to 55%, no clear wall motion abnormalities however difficult imaging, moderately dilated left atrial size, grossly normal mitral valve and tricuspid valve and aortic valve.  Patient was  placed on Lopressor and oral Cardizem yesterday with goals of discontinuing Cardizem drip.  Cardizem drip was discontinued last night however restarted early this morning due to A. fib with RVR per RN.  Continue IV heparin for anticoagulation.  It was noted that patient was on Xarelto prior to admission which we will hold off on resuming for now just in case patient may need some procedures.  Records from [facility] were being tried to obtain however per charge nurse  [facility] currently closed and unable to reach anyone.  Cardiology has been consulted and are following and appreciate input and recommendations.   2.  Thyroid mass versus large goiter/Lytic lesions on CT head CT with some pressure on the thoracic outlet.  Patient also noted to have what appeared to be lytic lesions and compression fractures from outside scans.  Patient denies any shortness of breath.  Patient denies any difficulty swallowing.  TSH is 4.301.  Free T4 of 0.87.  Patient does state that she may have had a biopsy done on this thyroid nodule however will need to discuss with patient's family.  Unable to find any information on epic.  Patient also noted to have difficulty expressing her words and sentences which is chronic per daughter.  Per daughter patient recently hospitalized noted to be anemic at that time requiring transfusion of 2 units packed red blood cells however no overt bleeding.  Will get UPEP/SPEP, skeletal survey.  Daughter also states that patient has had this thyroid mass and states that as long as she is swallowing okay and without any difficulties breathing she did not want anything invasive done at this time in terms of the thyroid mass.  Patient has been seen in consultation by oncology and due to mass-effect on trachea oncology recommending biopsy.  We will get a thyroid ultrasound.  Patient seen by palliative care and per palliative care and discussions with family patient is in agreement for FNA to further diagnose thyroid mass and then decision will be made as to further recommendations.  We will hold off on CT of abdomen and pelvis until FNA results are obtained.  Patient seems somewhat hesitant and may need to consult with IR for biopsy for further evaluation and management.  Myeloma work-up of SPEP, quantitative immunoglobulins, UPEP pending.  Oncology and palliative care following.   3.  Acute encephalopathy On admission patient noted not to be able to give any  adequate history.  Felt secondary to acute illness of underlying dementia.  Patient alert this morning.  Patient following commands appropriately.  Patient with an expressive aphasia from prior CVA.  Urinalysis worrisome for UTI.  Urine cultures with multiple species.  Continue empiric IV Rocephin D3/3. Continue supportive care.  4.  Hypoxia Felt likely secondary to asthma and COPD.  No evidence of pneumonia on x-rays.  Mass noted in chest appears to be thyroid mass.  Patient on 2 L nasal cannula which she was on prior to admission.  Follow.  5.  Multiple thoracic compression fractures Age-indeterminate.  Supportive care.  PT/OT.  6.  UTI Urinalysis worrisome for UTI.  Urine cultures with multiple species.  Patient however symptomatic with complaints of dysuria and burning when she wipes prior to admission and early on during the hospitalization.  Patient states dysuria is improving..  Continue IV Rocephin D3/3.  7.  Bilateral pleural effusions Follow.  8.  Pressure ulcer, POA Wound care consult.  9.  Abdominal aortic aneurysm Incidental finding on CT scan.  Will  likely need close outpatient follow-up.  10.  Hypokalemia Repleted.  11.  History of CVA with expressive aphasia Stable.  Patient was on Xarelto currently on IV heparin which we will continue for now in anticipation of possible procedures.  Once no further procedures will place back on Xarelto..   DVT prophylaxis: Heparin Code Status: DNR Family Communication: Updated daughter via telephone. Disposition Plan: Remain in stepdown unit.  To be determined.   Consultants:   Cardiology: Dr. Harrell Gave 02/16/2019  Oncology: Dr. Lindi Adie 02/16/2019  Palliative care: Kathie Rhodes, NP 02/17/2019  Procedures:   2D echo 02/16/2019  Thyroid ultrasound 02/17/2019  Skeletal survey 02/16/2019  Antimicrobials:   IV Rocephin 02/16/2019   Subjective: Patient sitting up in chair.  States breathing is close to baseline.  Patient  with an expressive aphasia which is chronic.  Patient denies any chest pain.  Patient placed back on Cardizem drip early this morning after he was stopped yesterday evening.  Patient states some improvement with dysuria.  Objective: Vitals:   02/18/19 0700 02/18/19 0800 02/18/19 0900 02/18/19 1000  BP: (!) 117/58 (!) 95/53 (!) 100/59 112/62  Pulse: 63 62 (!) 47 (!) 53  Resp: 17 (!) 21 17 (!) 23  Temp:  97.8 F (36.6 C)    TempSrc:  Oral    SpO2: 93% 93%  100%  Weight:      Height:        Intake/Output Summary (Last 24 hours) at 02/18/2019 1017 Last data filed at 02/18/2019 1000 Gross per 24 hour  Intake 758.76 ml  Output 0 ml  Net 758.76 ml   Filed Weights   02/16/19 0000 02/17/19 0500 02/18/19 0500  Weight: 54 kg 54.2 kg 58.7 kg    Examination:  General exam: NAD HEENT: Thyroid mass .  Oropharynx is clear. Respiratory system: Some bibasilar crackles in the bases.  Some decreased breath sounds in the bases.  No wheezing.  No rhonchi.  Normal respiratory effort. Cardiovascular system: Irregularly irregular.  No JVD, no murmurs, no lower extremity edema.  Gastrointestinal system: Abdomen is soft, nontender, nondistended, positive bowel sounds.  No rebound.  No guarding.  Central nervous system: Alert and oriented to self place and time.  Patient with an expressive aphasia which is chronic.  No focal neurological deficits. Extremities: Symmetric 5 x 5 power. Skin: No rashes, lesions or ulcers Psychiatry: Judgement and insight appear normal. Mood & affect appropriate.     Data Reviewed: I have personally reviewed following labs and imaging studies  CBC: Recent Labs  Lab 02/16/19 0047 02/17/19 0300 02/18/19 0449  WBC 9.7 7.4 6.9  HGB 11.4* 10.6* 10.6*  HCT 39.8 36.7 39.1  MCV 83.1 82.7 84.8  PLT 175 151 779*   Basic Metabolic Panel: Recent Labs  Lab 02/16/19 0047 02/17/19 0300 02/18/19 0449  NA 136 136 133*  K 4.1 3.2* 3.8  CL 97* 100 100  CO2 _0 GLUCOSE 124* 119* 113*  BUN 49* 46* 40*  CREATININE 1.71* 1.36* 1.14*  CALCIUM 8.8* 8.3* 8.3*  MG 2.1  --   --    GFR: Estimated Creatinine Clearance: 30.6 mL/min (A) (by C-G formula based on SCr of 1.14 mg/dL (H)). Liver Function Tests: Recent Labs  Lab 02/16/19 0047  AST 62*  ALT 152*  ALKPHOS 120  BILITOT 1.4*  PROT 6.3*  ALBUMIN 3.6   No results for input(s): LIPASE, AMYLASE in the last 168 hours. No results for input(s): AMMONIA in the last  168 hours. Coagulation Profile: Recent Labs  Lab 02/16/19 0046  INR 1.6*   Cardiac Enzymes: Recent Labs  Lab 02/16/19 1032  TROPONINI <0.03   BNP (last 3 results) No results for input(s): PROBNP in the last 8760 hours. HbA1C: No results for input(s): HGBA1C in the last 72 hours. CBG: No results for input(s): GLUCAP in the last 168 hours. Lipid Profile: No results for input(s): CHOL, HDL, LDLCALC, TRIG, CHOLHDL, LDLDIRECT in the last 72 hours. Thyroid Function Tests: Recent Labs    02/16/19 1012  TSH 4.301  FREET4 0.87   Anemia Panel: No results for input(s): VITAMINB12, FOLATE, FERRITIN, TIBC, IRON, RETICCTPCT in the last 72 hours. Sepsis Labs: No results for input(s): PROCALCITON, LATICACIDVEN in the last 168 hours.  Recent Results (from the past 240 hour(s))  MRSA PCR Screening     Status: None   Collection Time: 02/15/19 11:43 PM  Result Value Ref Range Status   MRSA by PCR NEGATIVE NEGATIVE Final    Comment:        The GeneXpert MRSA Assay (FDA approved for NASAL specimens only), is one component of a comprehensive MRSA colonization surveillance program. It is not intended to diagnose MRSA infection nor to guide or monitor treatment for MRSA infections. Performed at [facility], [place] [address] [place], [place] [address]   Culture, Urine     Status: Abnormal   Collection Time: 02/16/19  4:03 AM  Result Value Ref Range Status   Specimen Description   Final    URINE, CLEAN  CATCH Performed at [facility], [place] [address], [place], [place] [address]    Special Requests   Final    NONE Performed at [facility], [place] [address], [place], [place] [address]    Culture MULTIPLE SPECIES PRESENT, SUGGEST RECOLLECTION (A)  Final   Report Status 02/17/2019 FINAL  Final         Radiology Studies: Dg Bone Survey Met  Result Date: 02/16/2019 CLINICAL DATA:  Lytic lesion of bone on prior x-ray EXAM: METASTATIC BONE SURVEY COMPARISON:  None Correlation CT head and CT chest 02/15/2019, chest radiograph 02/15/2019 FINDINGS: Enlargement of cardiac silhouette. Tracheal deviation RIGHT to LEFT by large RIGHT thyroid mass. Probable mild pulmonary edema with small BILATERAL small pleural effusions and minimal basilar atelectasis. Extensive atherosclerotic calcifications aorta. Diffuse osseous demineralization. Lytic lesion LEFT parietal calvarium 19 x 14 mm. Compression fractures are identified at T12, T9 T4 and T3, question insufficiency fractures of underlying pathologic lesions are not excluded. Questionable tiny lytic bone lesion versus artifact at mid LEFT femoral diaphysis. Old healed fracture mid LEFT fibular and distal LEFT radial fractures. Prior RIGHT olecranon ORIF. No additional lytic or sclerotic bone lesions identified. IMPRESSION: Lytic lesion LEFT parietal calvarium question lytic metastasis versus multiple myeloma. Multiple thoracic spine compression fractures which could be insufficiency fractures no underlying pathologic lesions are not excluded, consider MR assessment. Questionable tiny lytic lesion versus artifact at the mid LEFT femoral diaphysis. CHF with small bibasilar pleural effusions and basilar atelectasis. RIGHT thyroid mass with tracheal deviation RIGHT to LEFT. Enlargement of cardiac silhouette. Electronically Signed   By: Lavonia Dana M.D.   On: 02/16/2019 12:45   US Thyroid  Result Date: 02/17/2019 CLINICAL DATA:   Thyroid mass, right EXAM: THYROID ULTRASOUND TECHNIQUE: Ultrasound examination of the thyroid gland and adjacent soft tissues was performed. COMPARISON:  CT 02/15/2019 FINDINGS: Parenchymal Echotexture: Normal (left); markedly heterogenous (right) Isthmus: 0.5 cm thickness Right lobe: 8  x 5.8 x 6.9 cm Left lobe: 5.1 x 2.5 x 1.8 cm _________________________________________________________ Estimated total number of nodules >/= 1 cm: 3 Number of spongiform nodules >/=  2 cm not described below (TR1): 0 Number of mixed cystic and solid nodules >/= 1.5 cm not described below (TR2): 0 _________________________________________________________ Right lobe is nearly completely replaced by complex mostly solid mass without calcifications; no appreciable normal right lobe thyroid tissue is evident Left findings: Nodule # 1: Location: Left; Superior Maximum size: 2.1 cm; Other 2 dimensions: 1.5 x 1.2 cm Composition: mixed cystic and solid (1) Echogenicity: isoechoic (1) Shape: not taller-than-wide (0) Margins: smooth (0) Echogenic foci: none (0) ACR TI-RADS total points: 2. ACR TI-RADS risk category: TR2 (2 points). ACR TI-RADS recommendations: This nodule does NOT meet TI-RADS criteria for biopsy or dedicated follow-up. _________________________________________________________ Nodule # 2: Location: Left; Mid Maximum size: 2.4 cm; Other 2 dimensions: 1.9 x 1.7 cm Composition: cystic/almost completely cystic (0) This nodule does NOT meet TI-RADS criteria for biopsy or dedicated follow-up. _________________________________________________________ Nodule # 3: Location: Left; Inferior Maximum size: 1.2 cm; Other 2 dimensions: 1 cm Composition: cystic/almost completely cystic (0) This nodule does NOT meet TI-RADS criteria for biopsy or dedicated follow-up. _________________________________________________________ IMPRESSION: 1. Thyroid mass replacing most of the right lobe. Recommend FNA biopsy. 2. Smaller cystic nodules in the left  lobe, none of which meet criteria for biopsy or dedicated imaging follow-up. The above is in keeping with the ACR TI-RADS recommendations - J Am Coll Radiol 2017;14:587-595. Electronically Signed   By: Lucrezia Europe M.D.   On: 02/17/2019 09:32        Scheduled Meds:  Chlorhexidine Gluconate Cloth  6 each Topical Daily   diltiazem  120 mg Oral Daily   mouth rinse  15 mL Mouth Rinse BID   metoprolol tartrate  12.5 mg Oral Q6H   mometasone-formoterol  2 puff Inhalation BID   Continuous Infusions:  sodium chloride 50 mL/hr at 02/16/19 0600   cefTRIAXone (ROCEPHIN)  IV Stopped (02/17/19 1443)   diltiazem (CARDIZEM) infusion 5 mg/hr (02/18/19 0500)   heparin Stopped (02/18/19 0919)     LOS: 3 days    Time spent: 40 minutes    Irine Seal, MD [facility]  If 7PM-7AM, please contact night-coverage [email or web address] 02/18/2019, 10:17 AM

## 2019-02-18 NOTE — Progress Notes (Signed)
Was unable to get patient's records from other facility due to their being closed for Covid-19, and tried to call [facility] but they did not have access to those records.

## 2019-02-18 NOTE — Progress Notes (Signed)
Progress Note  Patient Name: Carla Kemp Date of Encounter: 02/18/2019  Primary Cardiologist: [facility] in [place]   Sitting in chair sleeping. Placed back on dilt drip this AM. Planned for biopsy this AM.  Inpatient Medications    Scheduled Meds:  Chlorhexidine Gluconate Cloth  6 each Topical Daily   diltiazem  120 mg Oral Daily   mouth rinse  15 mL Mouth Rinse BID   metoprolol tartrate  12.5 mg Oral Q6H   mometasone-formoterol  2 puff Inhalation BID   Continuous Infusions:  sodium chloride 50 mL/hr at 02/16/19 0600   cefTRIAXone (ROCEPHIN)  IV Stopped (02/17/19 1443)   diltiazem (CARDIZEM) infusion 5 mg/hr (02/18/19 0500)   heparin Stopped (02/18/19 0919)   PRN Meds: acetaminophen, lip balm, ondansetron **OR** ondansetron (ZOFRAN) IV   Vital Signs    Vitals:   02/18/19 0700 02/18/19 0800 02/18/19 0900 02/18/19 1000  BP: (!) 117/58 (!) 95/53 (!) 100/59 112/62  Pulse: 63 62 (!) 47 (!) 53  Resp: 17 (!) 21 17 (!) 23  Temp:  97.8 F (36.6 C)    TempSrc:  Oral    SpO2: 93% 93%  100%  Weight:      Height:        Intake/Output Summary (Last 24 hours) at 02/18/2019 1027 Last data filed at 02/18/2019 1000 Gross per 24 hour  Intake 758.76 ml  Output 0 ml  Net 758.76 ml   Last 3 Weights 02/18/2019 02/17/2019 02/16/2019  Weight (lbs) 129 lb 6.6 oz 119 lb 7.8 oz 119 lb 0.8 oz  Weight (kg) 58.7 kg 54.2 kg 54 kg      Telemetry    Atrial fib, most rates upper 90s/low 100s to 110s - Personally Reviewed  ECG    No new- Personally Reviewed  Physical Exam   GEN: Sleeping in chair, awakens to voice, in NAD Neck: No JVD appreciated Cardiac: irregularly irregular, no murmurs appreciated. Respiratory: Clear to auscultation bilaterally at upper fields, diminished at bases GI: Soft, nontender, non-distended  MS: No edema; No deformity. Neuro:  Nonfocal  Psych: Normal affect   Labs    Chemistry Recent Labs  Lab 02/16/19 0047 02/17/19 0300  02/18/19 0449  NA 136 136 133*  K 4.1 3.2* 3.8  CL 97* 100 100  CO2 '26 25 22  '$ GLUCOSE 124* 119* 113*  BUN 49* 46* 40*  CREATININE 1.71* 1.36* 1.14*  CALCIUM 8.8* 8.3* 8.3*  PROT 6.3*  --   --   ALBUMIN 3.6  --   --   AST 62*  --   --   ALT 152*  --   --   ALKPHOS 120  --   --   BILITOT 1.4*  --   --   GFRNONAA 28* 36* 45*  GFRAA 32* 42* 52*  ANIONGAP '13 11 11     '$ Hematology Recent Labs  Lab 02/16/19 0047 02/17/19 0300 02/18/19 0449  WBC 9.7 7.4 6.9  RBC 4.79 4.44 4.61  HGB 11.4* 10.6* 10.6*  HCT 39.8 36.7 39.1  MCV 83.1 82.7 84.8  MCH 23.8* 23.9* 23.0*  MCHC 28.6* 28.9* 27.1*  RDW 21.2* 21.6* 21.4*  PLT 175 151 141*    Cardiac Enzymes Recent Labs  Lab 02/16/19 1032  TROPONINI <0.03   No results for input(s): TROPIPOC in the last 168 hours.   BNPNo results for input(s): BNP, PROBNP in the last 168 hours.   DDimer No results for input(s): DDIMER in the last 168 hours.  Radiology    Dg Bone Survey Met  Result Date: 02/16/2019 CLINICAL DATA:  Lytic lesion of bone on prior x-ray EXAM: METASTATIC BONE SURVEY COMPARISON:  None Correlation CT head and CT chest 02/15/2019, chest radiograph 02/15/2019 FINDINGS: Enlargement of cardiac silhouette. Tracheal deviation RIGHT to LEFT by large RIGHT thyroid mass. Probable mild pulmonary edema with small BILATERAL small pleural effusions and minimal basilar atelectasis. Extensive atherosclerotic calcifications aorta. Diffuse osseous demineralization. Lytic lesion LEFT parietal calvarium 19 x 14 mm. Compression fractures are identified at T12, T9 T4 and T3, question insufficiency fractures of underlying pathologic lesions are not excluded. Questionable tiny lytic bone lesion versus artifact at mid LEFT femoral diaphysis. Old healed fracture mid LEFT fibular and distal LEFT radial fractures. Prior RIGHT olecranon ORIF. No additional lytic or sclerotic bone lesions identified. IMPRESSION: Lytic lesion LEFT parietal calvarium question  lytic metastasis versus multiple myeloma. Multiple thoracic spine compression fractures which could be insufficiency fractures no underlying pathologic lesions are not excluded, consider MR assessment. Questionable tiny lytic lesion versus artifact at the mid LEFT femoral diaphysis. CHF with small bibasilar pleural effusions and basilar atelectasis. RIGHT thyroid mass with tracheal deviation RIGHT to LEFT. Enlargement of cardiac silhouette. Electronically Signed   By: Lavonia Dana M.D.   On: 02/16/2019 12:45   US Thyroid  Result Date: 02/17/2019 CLINICAL DATA:  Thyroid mass, right EXAM: THYROID ULTRASOUND TECHNIQUE: Ultrasound examination of the thyroid gland and adjacent soft tissues was performed. COMPARISON:  CT 02/15/2019 FINDINGS: Parenchymal Echotexture: Normal (left); markedly heterogenous (right) Isthmus: 0.5 cm thickness Right lobe: 8 x 5.8 x 6.9 cm Left lobe: 5.1 x 2.5 x 1.8 cm _________________________________________________________ Estimated total number of nodules >/= 1 cm: 3 Number of spongiform nodules >/=  2 cm not described below (TR1): 0 Number of mixed cystic and solid nodules >/= 1.5 cm not described below (TR2): 0 _________________________________________________________ Right lobe is nearly completely replaced by complex mostly solid mass without calcifications; no appreciable normal right lobe thyroid tissue is evident Left findings: Nodule # 1: Location: Left; Superior Maximum size: 2.1 cm; Other 2 dimensions: 1.5 x 1.2 cm Composition: mixed cystic and solid (1) Echogenicity: isoechoic (1) Shape: not taller-than-wide (0) Margins: smooth (0) Echogenic foci: none (0) ACR TI-RADS total points: 2. ACR TI-RADS risk category: TR2 (2 points). ACR TI-RADS recommendations: This nodule does NOT meet TI-RADS criteria for biopsy or dedicated follow-up. _________________________________________________________ Nodule # 2: Location: Left; Mid Maximum size: 2.4 cm; Other 2 dimensions: 1.9 x 1.7 cm  Composition: cystic/almost completely cystic (0) This nodule does NOT meet TI-RADS criteria for biopsy or dedicated follow-up. _________________________________________________________ Nodule # 3: Location: Left; Inferior Maximum size: 1.2 cm; Other 2 dimensions: 1 cm Composition: cystic/almost completely cystic (0) This nodule does NOT meet TI-RADS criteria for biopsy or dedicated follow-up. _________________________________________________________ IMPRESSION: 1. Thyroid mass replacing most of the right lobe. Recommend FNA biopsy. 2. Smaller cystic nodules in the left lobe, none of which meet criteria for biopsy or dedicated imaging follow-up. The above is in keeping with the ACR TI-RADS recommendations - J Am Coll Radiol 2017;14:587-595. Electronically Signed   By: Lucrezia Europe M.D.   On: 02/17/2019 09:32    Cardiac Studies   I personally read her echo this morning.   1. The left ventricle has low normal systolic function, with an ejection fraction of 50-55%. The cavity size was normal. Left ventricular diastolic function could not be evaluated secondary to atrial fibrillation. 2. Very difficult imaging, no clear WMA but sensitivity reduced based  on limitations of the study. 3. Left atrial size was moderately dilated. 4. The mitral valve is grossly normal. 5. The tricuspid valve is grossly normal. Tricuspid valve regurgitation is moderate. 6. The aortic valve is grossly normal. Mild thickening of the aortic valve. Mild calcification of the aortic valve. Aortic valve regurgitation is mild by color flow Doppler. 7. The inferior vena cava was dilated in size with <50% respiratory variability. 8. The interatrial septum was not assessed.  SUMMARY  Technically difficult study, even with use of echo contrast. In atrial fibrillation with HRs just above 100. With contrast imaging, appears to have low normal EF, but reduced sensitivity limits analysis.  Patient Profile     83 y.o. female  with a hx of paroxysmal atrial fibrillation, asthma vs. COPD, recent hospitalization for for UTI with altered mental status who is being followed for the evaluation of atrial fibrillation with rapid ventricular response at the request of Dr. Irine Seal.  Assessment & Plan    1. Atrial fibrillation with RVR -CHA2DS2/VAS Stroke Risk Points=4 at least, or 6 if history of CVA -reportedly on xarelto prior to admission. Would continue heparin given potential thyroid biopsy/workup. If no active bleeding and no plans for additional procedures, xarelto could be restarted after potential biopsy. Likely will need reduced dose (15 mg) based on renal function, when preparing to transition would ask pharmacy for assistance with timing/dosage -reported prior reaction to amiodarone, unclear what this was (per daughter, potentially lung toxicity/alopecia) -on oral diltiazem starting today, just received. With need to go back on drip overnight, will increase dose to 240 mg tomorrow. Would continue fractionated metoprolol as well for now to maximize HR control, and if she is controlled on higher diltiazem dose tomorrow can stop metoprolol.  -given that she has a mediastinal mass with tracheal deviation, she is a high risk candidate for TEE, and she would need TEE prior to cardioversion as her anticoagulation history is unclear. Therefore, would not plan to cardiovert unless she becomes unstable. Would aim for rate control.  Additional cardiovascular concerns, not update today but for reference.: -her echo images are very difficult. Without contrast, appears that her EF might be reduced, but with use of echo contrast appears it may be low normal function. She reportedly has a history of heart failure, unclear if this is systolic or diastolic -per notes, reportedly has history of cardiac cath/stents. Unknown location, dates, etc. Denies chest pain, initial troponin negative, ECG without acute ischemic  changes -reported history of hypertension and hyperlipidemia. Has had some hypotension with RVR. -would be helpful if we could get records of cath, echo, medications, other testing from [facility] in [place].  This has been attempted, but apparently office closed, unable to get records.  TIME SPENT WITH PATIENT: 15 minutes of direct patient care. More than 50% of that time was spent on coordination of care and counseling regarding medication management of atrial fib.  Buford Dresser, MD, PhD [facility]   For questions or updates, please contact [place] Please consult [email or web address] for contact info under     Signed, Buford Dresser, MD  02/18/2019, 10:27 AM

## 2019-02-18 NOTE — Evaluation (Signed)
Physical Therapy Evaluation Patient Details Name: Carla Kemp MRN: [id number] DOB: 1935-12-15 Today's Date: 02/18/2019   History of Present Illness  83 yo female admitted to [facility] from [facility] on 4/29 for AMS, weakness, afib. Pt with recent hospitalization for afib with RVR. CT of head reveals L parietal lytic bone lesions, questionable for metastasis. CT chest reveals bilateral pleural effusion, RLL mass vs atelectasis, infrarenal abdominal aneurysm, thoracic compression fractures of unknown age, large thyroid lobe with L tracheal deviation and work up goiter vs malignancy. Pt also with questionable L femoral lesion. PMH includes COPD vs asthma, goiter, dementia, HTN, CVA with expressive aphasia, CKD.   Clinical Impression   Pt presents with back pain, R plantar foot pain, deconditioning, difficulty performing all mobility tasks, tachypnea with exertion, and decreased activity tolerance due to weakness. Pt to benefit from acute PT to address deficits. Pt required mod-max assist +2 for bed mobility and stand pivot transfers this session, pt reporting at baseline she walks in home with RW use. Pt very limited by fatigue today, and also presented with marked tachypnea after transfer to recliner to 41 bpm. PT recommending SNF placement to address mobility deficits post-acutely and return pt to PLOF. PT to progress mobility as tolerated, and will continue to follow acutely.   BP pre-PT: 100/59, BP post-transfer to recliner: 94/49 (61), asymptomatic for dizziness HR during session: 100-117 bpm  RR: 17-41 breaths/min SpO2 on 2LO2: 87% with exertion, up to low 90s with rest    Follow Up Recommendations SNF;Supervision/Assistance - 24 hour    Equipment Recommendations  None recommended by PT    Recommendations for Other Services       Precautions / Restrictions Precautions Precautions: Fall;Back Precaution Comments: thoracic compression fractures of unknown age, pt severely kyphotic  with forward flexion of trunk  Restrictions Weight Bearing Restrictions: No      Mobility  Bed Mobility Overal bed mobility: Needs Assistance Bed Mobility: Supine to Sit     Supine to sit: Max assist;HOB elevated;+2 for safety/equipment     General bed mobility comments: Max assist for LE lifting and translation to EOB, trunk elevation with HOB elevated >30*, scooting to EOB.   Transfers Overall transfer level: Needs assistance Equipment used: 2 person hand held assist Transfers: Stand Pivot Transfers   Stand pivot transfers: +2 physical assistance;Mod assist;+2 safety/equipment;Max assist       General transfer comment: stand pivot transfer x2, once from bed to [facility] and once from [facility] to recliner. Mod-max assist +2 for power up, steadying, guiding pt to destination surface, and slow lowering onto destination surface.   Ambulation/Gait Ambulation/Gait assistance: (NT - pt with mod-max assist +2 for stand pivot transfer)              Stairs            Wheelchair Mobility    Modified Rankin (Stroke Patients Only)       Balance Overall balance assessment: Needs assistance Sitting-balance support: Feet supported;Bilateral upper extremity supported Sitting balance-Leahy Scale: Fair Sitting balance - Comments: able to sit EOB without PT support, forward leaning due to severe kyphosis Postural control: Other (comment)(anterior leaning) Standing balance support: Bilateral upper extremity supported;During functional activity Standing balance-Leahy Scale: Zero Standing balance comment: reliant on PT and PT aide for standing balance                             Pertinent Vitals/Pain Pain Assessment: Faces Faces  Pain Scale: Hurts little more Pain Location: back, R heel of foot Pain Descriptors / Indicators: Sore;Discomfort;Grimacing Pain Intervention(s): Limited activity within patient's tolerance;Monitored during session;Repositioned    Home Living  Family/patient expects to be discharged to:: Private residence Living Arrangements: Children(lives with daughter named Nevin Bloodgood ) Available Help at Discharge: Family Type of Home: House Home Access: Level entry     Home Layout: One level Home Equipment: Environmental consultant - 2 wheels;Bedside commode      Prior Function Level of Independence: Needs assistance   Gait / Transfers Assistance Needed: Pt reports using RW for ambulation, and does not require assist from daughter to mobilize.   ADL's / Homemaking Assistance Needed: Pt states her daughter does household tasks, and assists her with dressing/bathing as needed.        Hand Dominance   Dominant Hand: Right    Extremity/Trunk Assessment   Upper Extremity Assessment Upper Extremity Assessment: Generalized weakness    Lower Extremity Assessment Lower Extremity Assessment: LLE deficits/detail;RLE deficits/detail RLE Deficits / Details: formal MMT not completed; pt unable to perform full AROM knee and hip extension in standing, requires assist to perform bilateral hip flexion and abd/add to come to sitting EOB.  LLE Deficits / Details: formal MMT not completed; pt unable to perform full AROM knee and hip extension in standing, requires assist to perform bilateral hip flexion and abd/add to come to sitting EOB.     Cervical / Trunk Assessment Cervical / Trunk Assessment: Kyphotic  Communication   Communication: Expressive difficulties  Cognition Arousal/Alertness: Awake/alert Behavior During Therapy: WFL for tasks assessed/performed Overall Cognitive Status: No family/caregiver present to determine baseline cognitive functioning                                 General Comments: Pt responds appropriately to questions, at times complicated by pt's expressive aphasia. Pt with increased time to respond to questions. Pt very pleasant, personal history of dementia.       General Comments General comments (skin integrity, edema,  etc.): Pt reporting new onset R heel pain, TTP but pt reports feeling stretch with seated gastrocnemius stretch. Suspect related to plantar fascia.     Exercises General Exercises - Lower Extremity Ankle Circles/Pumps: AROM;Both;10 reps;Seated   Assessment/Plan    PT Assessment Patient needs continued PT services  PT Problem List Decreased strength;Decreased mobility;Decreased safety awareness;Decreased range of motion;Decreased activity tolerance;Decreased balance;Pain;Decreased knowledge of precautions       PT Treatment Interventions DME instruction;Functional mobility training;Patient/family education;Balance training;Gait training;Therapeutic activities;Therapeutic exercise;Neuromuscular re-education    PT Goals (Current goals can be found in the Care Plan section)  Acute Rehab PT Goals Patient Stated Goal: none stated  PT Goal Formulation: With patient Time For Goal Achievement: 03/04/19 Potential to Achieve Goals: Good    Frequency Min 2X/week   Barriers to discharge        Co-evaluation               AM-PAC PT "6 Clicks" Mobility  Outcome Measure Help needed turning from your back to your side while in a flat bed without using bedrails?: Total Help needed moving from lying on your back to sitting on the side of a flat bed without using bedrails?: Total Help needed moving to and from a bed to a chair (including a wheelchair)?: Total Help needed standing up from a chair using your arms (e.g., wheelchair or bedside chair)?: Total Help needed to  walk in hospital room?: Total Help needed climbing 3-5 steps with a railing? : Total 6 Click Score: 6    End of Session Equipment Utilized During Treatment: Gait belt;Oxygen Activity Tolerance: Patient limited by fatigue;Patient limited by pain Patient left: in chair;with chair alarm set;with call bell/phone within reach Nurse Communication: Mobility status PT Visit Diagnosis: Other abnormalities of gait and mobility  (R26.89);Muscle weakness (generalized) (M62.81);Unsteadiness on feet (R26.81)    Time: 3475-8307 PT Time Calculation (min) (ACUTE ONLY): 25 min   Charges:   PT Evaluation $PT Eval Low Complexity: 1 Low PT Treatments $Therapeutic Activity: 8-22 mins       Teja Judice Conception Chancy, PT Acute Rehabilitation Services Pager [phone]  Office [phone]  Zamirah Denny D Rocko Fesperman 02/18/2019, 10:44 AM

## 2019-02-18 NOTE — Progress Notes (Signed)
Attempted to get medical records from [facility], at this time the office is closed for precautions during COVID outbreak. Attempting to get records from [place] records, unable to get phone contact will continue to attempt.

## 2019-02-18 NOTE — Evaluation (Signed)
Occupational Therapy Evaluation Patient Details Name: Carla Kemp MRN: [id number] DOB: [birth year] Today's Date: 02/18/2019    History of Present Illness 83 yo female admitted to [facility] from [facility] on 4/29 for AMS, weakness, afib. Pt with recent hospitalization for afib with RVR. CT of head reveals L parietal lytic bone lesions, questionable for metastasis. CT chest reveals bilateral pleural effusion, RLL mass vs atelectasis, infrarenal abdominal aneurysm, thoracic compression fractures of unknown age, large thyroid lobe with L tracheal deviation and work up goiter vs malignancy. Pt also with questionable L femoral lesion. PMH includes COPD vs asthma, goiter, dementia, HTN, CVA with expressive aphasia, CKD.    Clinical Impression   Pt was seen at bed level for initial evaluation. She stated that she was tired from having bx. She did get up with PT earlier.  Pt has assistance from daughter at baseline.  Pt has expressive aphasia, but she was able to communicate some of her thoughts.  Will follow in acute setting with min A level goals for SPT to 3:1 commode and UB adls.     Follow Up Recommendations  Supervision/Assistance - 24 hour;SNF    Equipment Recommendations  None recommended by OT    Recommendations for Other Services       Precautions / Restrictions Precautions Precautions: Fall;Back Precaution Comments: thoracic compression fractures of unknown age, pt severely kyphotic with forward flexion of trunk  Restrictions Weight Bearing Restrictions: No      Mobility Bed Mobility               General bed mobility comments: not performed due to fatique  Transfers                 General transfer comment: not performed    Balance                                           ADL either performed or assessed with clinical judgement   ADL Overall ADL's : Needs assistance/impaired Eating/Feeding: Set up   Grooming: Minimal  assistance   Upper Body Bathing: Moderate assistance   Lower Body Bathing: Maximal assistance   Upper Body Dressing : Moderate assistance   Lower Body Dressing: Total assistance                 General ADL Comments: pt assessed from bed level. She did not want to sit up; fatiqued from test      Vision         Perception     Praxis      Pertinent Vitals/Pain Pain Assessment: Faces Faces Pain Scale: Hurts little more Pain Location: back, R heel of foot Pain Descriptors / Indicators: Sore;Discomfort;Grimacing Pain Intervention(s): Limited activity within patient's tolerance;Monitored during session     Hand Dominance Right   Extremity/Trunk Assessment Upper Extremity Assessment Upper Extremity Assessment: Generalized weakness       Cervical / Trunk Assessment Cervical / Trunk Assessment: Kyphotic   Communication Communication Communication: Expressive difficulties(sometimes gets subject out)   Cognition Arousal/Alertness: Awake/alert Behavior During Therapy: WFL for tasks assessed/performed Overall Cognitive Status: No family/caregiver present to determine baseline cognitive functioning                                 General Comments: follows commands;h/o dementia   General Comments  Exercises     Shoulder Instructions      Home Living Family/patient expects to be discharged to:: Private residence Living Arrangements: Children Available Help at Discharge: Family               Bathroom Shower/Tub: Teacher, early years/pre: Standard     Home Equipment: Environmental consultant - 2 wheels;Bedside commode          Prior Functioning/Environment    Gait / Transfers Assistance Needed: Pt reports using RW for ambulation, and does not require assist from daughter to mobilize.  ADL's / Homemaking Assistance Needed: Pt states her daughter does household tasks, and assists her with dressing/bathing as needed. Communication /  Swallowing Assistance Needed: expressive aphasia          OT Problem List: Decreased strength;Decreased activity tolerance;Decreased knowledge of use of DME or AE;Pain(balance NT)      OT Treatment/Interventions: Self-care/ADL training;DME and/or AE instruction;Patient/family education;Balance training;Therapeutic activities    OT Goals(Current goals can be found in the care plan section) Acute Rehab OT Goals Patient Stated Goal: none stated  OT Goal Formulation: With patient Time For Goal Achievement: 03/04/19 Potential to Achieve Goals: Fair ADL Goals Pt Will Transfer to Toilet: with min assist;bedside commode;stand pivot transfer Additional ADL Goal #1: pt will perform UB adls and grooming with min A from sitting  OT Frequency: Min 2X/week   Barriers to D/C:            Co-evaluation              AM-PAC OT "6 Clicks" Daily Activity     Outcome Measure Help from another person eating meals?: A Little Help from another person taking care of personal grooming?: A Little Help from another person toileting, which includes using toliet, bedpan, or urinal?: Total Help from another person bathing (including washing, rinsing, drying)?: A Lot Help from another person to put on and taking off regular upper body clothing?: A Lot Help from another person to put on and taking off regular lower body clothing?: Total 6 Click Score: 12   End of Session    Activity Tolerance: Patient limited by fatigue Patient left: in bed;with call bell/phone within reach;with bed alarm set  OT Visit Diagnosis: Muscle weakness (generalized) (M62.81)                Time: 8413-2440 OT Time Calculation (min): 15 min Charges:  OT General Charges $OT Visit: 1 Visit OT Evaluation $OT Eval Low Complexity: [place], OTR/L Acute Rehabilitation Services [phone] [facility] pager [phone] office 02/18/2019  [place] 02/18/2019, 3:46 PM

## 2019-02-18 NOTE — Progress Notes (Addendum)
HEMATOLOGY-ONCOLOGY PROGRESS NOTE  SUBJECTIVE: Patient more alert and talkative. Had biopsy of thyroid nodule this morning. Denies difficulty swallowing and shortness of breath. No other complaints today.  REVIEW OF SYSTEMS:   Constitutional: Denies fevers, chills Eyes: Denies blurriness of vision Ears, nose, mouth, throat, and face: Denies mucositis or sore throat Respiratory: Denies cough, dyspnea or wheezes Cardiovascular: Denies palpitation, chest discomfort Gastrointestinal:  Denies nausea, heartburn or change in bowel habits Skin: Denies abnormal skin rashes Lymphatics: Denies new lymphadenopathy or easy bruising Neurological:Denies numbness, tingling or new weaknesses Behavioral/Psych: Mood is stable, no new changes  Extremities: No lower extremity edema All other systems were reviewed with the patient and are negative.  I have reviewed the past medical history, past surgical history, social history and family history with the patient and they are unchanged from previous note.   PHYSICAL EXAMINATION:  Vitals:   02/18/19 1200 02/18/19 1248  BP: 108/73   Pulse:  (!) 110  Resp:    Temp:    SpO2:     Filed Weights   02/16/19 0000 02/17/19 0500 02/18/19 0500  Weight: 119 lb 0.8 oz (54 kg) 119 lb 7.8 oz (54.2 kg) 129 lb 6.6 oz (58.7 kg)    Intake/Output from previous day: 04/30 0701 - 05/01 0700 In: 347.5 [I.V.:247.5; IV Piggyback:100] Out: -   GENERAL:alert, no distress and comfortable SKIN: skin color, texture, turgor are normal, no rashes or significant lesions EYES: normal, Conjunctiva are pink and non-injected, sclera clear OROPHARYNX:no exudate, no erythema and lips, buccal mucosa, and tongue normal  NECK: thyroid nodule palpable. Non-tender.  LYMPH:  no palpable lymphadenopathy in the cervical, axillary or inguinal LUNGS: clear to auscultation and percussion with normal breathing effort HEART: Irregular and no lower extremity edema ABDOMEN:abdomen soft,  non-tender and normal bowel sounds Musculoskeletal:no cyanosis of digits and no clubbing  NEURO: alert & oriented x 3. Had mild expressive aphasia. No focal motor/sensory deficits  LABORATORY DATA:  I have reviewed the data as listed CMP Latest Ref Rng & Units 02/18/2019 02/17/2019 02/16/2019  Glucose 70 - 99 mg/dL 113(H) 119(H) 124(H)  BUN 8 - 23 mg/dL 40(H) 46(H) 49(H)  Creatinine 0.44 - 1.00 mg/dL 1.14(H) 1.36(H) 1.71(H)  Sodium 135 - 145 mmol/L 133(L) 136 136  Potassium 3.5 - 5.1 mmol/L 3.8 3.2(L) 4.1  Chloride 98 - 111 mmol/L 100 100 97(L)  CO2 22 - 32 mmol/L _0 Calcium 8.9 - 10.3 mg/dL 8.3(L) 8.3(L) 8.8(L)  Total Protein 6.5 - 8.1 g/dL - - 6.3(L)  Total Bilirubin 0.3 - 1.2 mg/dL - - 1.4(H)  Alkaline Phos 38 - 126 U/L - - 120  AST 15 - 41 U/L - - 62(H)  ALT 0 - 44 U/L - - 152(H)    Lab Results  Component Value Date   WBC 6.9 02/18/2019   HGB 10.6 (L) 02/18/2019   HCT 39.1 02/18/2019   MCV 84.8 02/18/2019   PLT 141 (L) 02/18/2019    Dg Bone Survey Met  Result Date: 02/16/2019 CLINICAL DATA:  Lytic lesion of bone on prior x-ray EXAM: METASTATIC BONE SURVEY COMPARISON:  None Correlation CT head and CT chest 02/15/2019, chest radiograph 02/15/2019 FINDINGS: Enlargement of cardiac silhouette. Tracheal deviation RIGHT to LEFT by large RIGHT thyroid mass. Probable mild pulmonary edema with small BILATERAL small pleural effusions and minimal basilar atelectasis. Extensive atherosclerotic calcifications aorta. Diffuse osseous demineralization. Lytic lesion LEFT parietal calvarium 19 x 14 mm. Compression fractures are identified at T12, T9 T4 and T3,  question insufficiency fractures of underlying pathologic lesions are not excluded. Questionable tiny lytic bone lesion versus artifact at mid LEFT femoral diaphysis. Old healed fracture mid LEFT fibular and distal LEFT radial fractures. Prior RIGHT olecranon ORIF. No additional lytic or sclerotic bone lesions identified. IMPRESSION: Lytic  lesion LEFT parietal calvarium question lytic metastasis versus multiple myeloma. Multiple thoracic spine compression fractures which could be insufficiency fractures no underlying pathologic lesions are not excluded, consider MR assessment. Questionable tiny lytic lesion versus artifact at the mid LEFT femoral diaphysis. CHF with small bibasilar pleural effusions and basilar atelectasis. RIGHT thyroid mass with tracheal deviation RIGHT to LEFT. Enlargement of cardiac silhouette. Electronically Signed   By: Lavonia Dana M.D.   On: 02/16/2019 12:45   US Thyroid  Result Date: 02/17/2019 CLINICAL DATA:  Thyroid mass, right EXAM: THYROID ULTRASOUND TECHNIQUE: Ultrasound examination of the thyroid gland and adjacent soft tissues was performed. COMPARISON:  CT 02/15/2019 FINDINGS: Parenchymal Echotexture: Normal (left); markedly heterogenous (right) Isthmus: 0.5 cm thickness Right lobe: 8 x 5.8 x 6.9 cm Left lobe: 5.1 x 2.5 x 1.8 cm _________________________________________________________ Estimated total number of nodules >/= 1 cm: 3 Number of spongiform nodules >/=  2 cm not described below (TR1): 0 Number of mixed cystic and solid nodules >/= 1.5 cm not described below (TR2): 0 _________________________________________________________ Right lobe is nearly completely replaced by complex mostly solid mass without calcifications; no appreciable normal right lobe thyroid tissue is evident Left findings: Nodule # 1: Location: Left; Superior Maximum size: 2.1 cm; Other 2 dimensions: 1.5 x 1.2 cm Composition: mixed cystic and solid (1) Echogenicity: isoechoic (1) Shape: not taller-than-wide (0) Margins: smooth (0) Echogenic foci: none (0) ACR TI-RADS total points: 2. ACR TI-RADS risk category: TR2 (2 points). ACR TI-RADS recommendations: This nodule does NOT meet TI-RADS criteria for biopsy or dedicated follow-up. _________________________________________________________ Nodule # 2: Location: Left; Mid Maximum size: 2.4  cm; Other 2 dimensions: 1.9 x 1.7 cm Composition: cystic/almost completely cystic (0) This nodule does NOT meet TI-RADS criteria for biopsy or dedicated follow-up. _________________________________________________________ Nodule # 3: Location: Left; Inferior Maximum size: 1.2 cm; Other 2 dimensions: 1 cm Composition: cystic/almost completely cystic (0) This nodule does NOT meet TI-RADS criteria for biopsy or dedicated follow-up. _________________________________________________________ IMPRESSION: 1. Thyroid mass replacing most of the right lobe. Recommend FNA biopsy. 2. Smaller cystic nodules in the left lobe, none of which meet criteria for biopsy or dedicated imaging follow-up. The above is in keeping with the ACR TI-RADS recommendations - J Am Coll Radiol 2017;14:587-595. Electronically Signed   By: Lucrezia Europe M.D.   On: 02/17/2019 09:32    ASSESSMENT AND PLAN: 1. Thyroid mass - goiter vs. malignancy. Daughter thinks the patient was told this was a goiter in the past. She is asymptomatic from this mass. Biopsy performed earlier today. Await pathology results. The patient has indicated that she may not want any further treatment for this issue. Palliative care is following for ongoing discussion of goals of care.   2. Left parietal lytic lesion - unclear etiology. May be a benign lesion. MM panel reviewed. She has IgA monoclonal protein with lambda light chain specificity. Light chains were not elevated and no M-spike was observed. This is consistent with MGUS. No further workup or treatment is recommended.    LOS: 3 days   Mikey Bussing, DNP, AGPCNP-BC, AOCNP 02/18/19   Attending Note Reviewed the labs. Patient doesnot have myeloma. Small amount of IgA Lambda (No M-protein) could be MGUS with normal Kappa:  Lambda ratio Bone survey: 2 areas of suspicious lytic lesions (skull and left femur): they are not diagnostic and could be bone islands or benign processes. There is not enough suspicion to  warrant bone marrow biopsy. Thyroid mass: Biopsied.  We will sign off since we dont manage thyroid cancers even if is thyroid malignancy (surgery and endocrinology can help with that) Please do not hesitate to call if you need our assistance.

## 2019-02-18 NOTE — Progress Notes (Signed)
Heparin paused at 0919 per the PA from IR in order to perform biopsy that is ordered.  Pharmacy aware

## 2019-02-18 NOTE — Progress Notes (Signed)
ANTICOAGULATION CONSULT NOTE - Follow Up Consult  Pharmacy Consult for heparin Indication: atrial fibrillation with RVR  Allergies  Allergen Reactions  . Amiodarone Other (See Comments)    Respiratory issues, hair loss    Patient Measurements: Height: 5\' 2"  (157.5 cm) Weight: 129 lb 6.6 oz (58.7 kg) IBW/kg (Calculated) : 50.1 Heparin Dosing Weight: 54 kg  Vital Signs: Temp: 98.5 F (36.9 C) (05/01 0400) Temp Source: Oral (05/01 0400) BP: 117/58 (05/01 0700) Pulse Rate: 63 (05/01 0700)  Labs: Recent Labs    02/16/19 0046  02/16/19 0047  02/16/19 1032 02/16/19 2053 02/16/19 2317 02/17/19 0300 02/18/19 0449  HGB  --    < > 11.4*  --   --   --   --  10.6* 10.6*  HCT  --   --  39.8  --   --   --   --  36.7 39.1  PLT  --   --  175  --   --   --   --  151 141*  APTT 31  --   --   --   --   --  82*  --   --   LABPROT 18.5*  --   --   --   --   --   --   --   --   INR 1.6*  --   --   --   --   --   --   --   --   HEPARINUNFRC  --   --   --    < >  --  0.71*  --  0.52 0.30  CREATININE  --   --  1.71*  --   --   --   --  1.36* 1.14*  TROPONINI  --   --   --   --  <0.03  --   --   --   --    < > = values in this interval not displayed.    Estimated Creatinine Clearance: 30.6 mL/min (A) (by C-G formula based on SCr of 1.14 mg/dL (H)).   Assessment: Patient's an 83 y.o F transferred to [facility] from [facility] on 4/29 for workup of mass noted on chest CT.  Heparin drip started on admission for afib with RVR.  Today, 02/18/2019: - heparin level is therapeutic at 0.30 - hgb low but stable, plts down 141 - no bleeding documented - Heme/onc recommends biopsy of thyroid nodule  Goal of Therapy:  Heparin level 0.3-0.7 units/ml Monitor platelets by anticoagulation protocol: Yes   Plan:  - with heparin drip at lower end of therapeutic range, will increase heparin drip up slightly to 700 units/hr - daily heparin level - monitor for s/s bleeding - if to proceed with biopsy,  please advise if/when heparin drip needs to be held for procedure.  Sherice Ijames P 02/18/2019,7:43 AM

## 2019-02-19 DIAGNOSIS — Z7901 Long term (current) use of anticoagulants: Secondary | ICD-10-CM

## 2019-02-19 LAB — BASIC METABOLIC PANEL
Anion gap: 11 (ref 5–15)
BUN: 38 mg/dL — ABNORMAL HIGH (ref 8–23)
CO2: 23 mmol/L (ref 22–32)
Calcium: 8.5 mg/dL — ABNORMAL LOW (ref 8.9–10.3)
Chloride: 98 mmol/L (ref 98–111)
Creatinine, Ser: 1.11 mg/dL — ABNORMAL HIGH (ref 0.44–1.00)
GFR calc Af Amer: 54 mL/min — ABNORMAL LOW (ref >60)
GFR calc non Af Amer: 47 mL/min — ABNORMAL LOW (ref >60)
Glucose, Bld: 119 mg/dL — ABNORMAL HIGH (ref 70–99)
Potassium: 3.7 mmol/L (ref 3.5–5.1)
Sodium: 132 mmol/L — ABNORMAL LOW (ref 135–145)

## 2019-02-19 LAB — HEPARIN LEVEL (UNFRACTIONATED)
Heparin Unfractionated: 0.25 IU/mL — ABNORMAL LOW (ref 0.30–0.70)
Heparin Unfractionated: 0.47 IU/mL (ref 0.30–0.70)

## 2019-02-19 LAB — CBC
HCT: 39.9 % (ref 36.0–46.0)
Hemoglobin: 11.3 g/dL — ABNORMAL LOW (ref 12.0–15.0)
MCH: 23.6 pg — ABNORMAL LOW (ref 26.0–34.0)
MCHC: 28.3 g/dL — ABNORMAL LOW (ref 30.0–36.0)
MCV: 83.5 fL (ref 80.0–100.0)
Platelets: 162 10*3/uL (ref 150–400)
RBC: 4.78 MIL/uL (ref 3.87–5.11)
RDW: 21.5 % — ABNORMAL HIGH (ref 11.5–15.5)
WBC: 8.9 10*3/uL (ref 4.0–10.5)
nRBC: 0.3 % — ABNORMAL HIGH (ref 0.0–0.2)

## 2019-02-19 MED ORDER — HEPARIN (PORCINE) 25000 UT/250ML-% IV SOLN
850.0000 [IU]/h | INTRAVENOUS | Status: AC
  Administered 2019-02-19 – 2019-02-22 (×4): 850 [IU]/h via INTRAVENOUS
  Filled 2019-02-19 (×3): qty 250

## 2019-02-19 NOTE — Progress Notes (Signed)
Progress Note  Patient Name: Carla Kemp Date of Encounter: 02/19/2019  Primary Cardiologist: [facility] in [place]  Subjective   More alert today, up in bed, conversation. No chest pain. Breathing at baseline. No other concerns today  Inpatient Medications    Scheduled Meds:  Chlorhexidine Gluconate Cloth  6 each Topical Daily   diltiazem  240 mg Oral Daily   mouth rinse  15 mL Mouth Rinse BID   metoprolol tartrate  12.5 mg Oral Q6H   mometasone-formoterol  2 puff Inhalation BID   Continuous Infusions:  sodium chloride 50 mL/hr at 02/19/19 0521   cefTRIAXone (ROCEPHIN)  IV Stopped (02/18/19 1448)   diltiazem (CARDIZEM) infusion 5 mg/hr (02/19/19 0521)   heparin 850 Units/hr (02/19/19 0438)   PRN Meds: acetaminophen, lip balm, ondansetron **OR** ondansetron (ZOFRAN) IV   Vital Signs    Vitals:   02/19/19 0335 02/19/19 0412 02/19/19 0500 02/19/19 0817  BP:   (!) 113/55   Pulse:      Resp: (!) 23  18   Temp: (!) 96.7 F (35.9 C)   98.7 F (37.1 C)  TempSrc: Axillary   Oral  SpO2:      Weight:  60.3 kg    Height:        Intake/Output Summary (Last 24 hours) at 02/19/2019 0851 Last data filed at 02/19/2019 0521 Gross per 24 hour  Intake 1492.19 ml  Output 0 ml  Net 1492.19 ml   Last 3 Weights 02/19/2019 02/18/2019 02/17/2019  Weight (lbs) 132 lb 15 oz 129 lb 6.6 oz 119 lb 7.8 oz  Weight (kg) 60.3 kg 58.7 kg 54.2 kg      Telemetry    Atrial fib, most rates upper 90s/low 100s - Personally Reviewed  ECG    No new- Personally Reviewed  Physical Exam   GEN: Sitting up in bed, [place] in place, in NAD Neck: No JVD appreciated Cardiac: irregularly irregular, no murmurs appreciated. Respiratory: Clear to auscultation bilaterally at upper fields, diminished at bases GI: Soft, nontender, non-distended  MS: No edema; No deformity. Neuro:  Nonfocal  Psych: Normal affect   Labs    Chemistry Recent Labs  Lab 02/16/19 0047 02/17/19 0300 02/18/19 0449  02/19/19 0250  NA 136 136 133* 132*  K 4.1 3.2* 3.8 3.7  CL 97* 100 100 98  CO2 26 25 22 23   GLUCOSE 124* 119* 113* 119*  BUN 49* 46* 40* 38*  CREATININE 1.71* 1.36* 1.14* 1.11*  CALCIUM 8.8* 8.3* 8.3* 8.5*  PROT 6.3*  --   --   --   ALBUMIN 3.6  --   --   --   AST 62*  --   --   --   ALT 152*  --   --   --   ALKPHOS 120  --   --   --   BILITOT 1.4*  --   --   --   GFRNONAA 28* 36* 45* 47*  GFRAA 32* 42* 52* 54*  ANIONGAP 13 11 11 11      Hematology Recent Labs  Lab 02/17/19 0300 02/18/19 0449 02/19/19 0250  WBC 7.4 6.9 8.9  RBC 4.44 4.61 4.78  HGB 10.6* 10.6* 11.3*  HCT 36.7 39.1 39.9  MCV 82.7 84.8 83.5  MCH 23.9* 23.0* 23.6*  MCHC 28.9* 27.1* 28.3*  RDW 21.6* 21.4* 21.5*  PLT 151 141* 162    Cardiac Enzymes Recent Labs  Lab 02/16/19 1032  TROPONINI <0.03   No results for input(s):  TROPIPOC in the last 168 hours.   BNPNo results for input(s): BNP, PROBNP in the last 168 hours.   DDimer No results for input(s): DDIMER in the last 168 hours.   Radiology    US Thyroid Fna Each Nodule  Result Date: 02/18/2019 INDICATION: Indeterminate thyroid nodule of the right thyroid. Request is made for fine need aspiration of indeterminate thyroid nodule. EXAM: ULTRASOUND GUIDED FINE NEEDLE ASPIRATION OF INDETERMINATE THYROID NODULE COMPARISON:  US THYROID 02/17/19 MEDICATIONS: 2 mL 1% lidocaine COMPLICATIONS: None immediate. TECHNIQUE: Informed written consent was obtained from the patient after a discussion of the risks, benefits and alternatives to treatment. Questions regarding the procedure were encouraged and answered. A timeout was performed prior to the initiation of the procedure. Pre-procedural ultrasound scanning demonstrated unchanged size and appearance of the indeterminate nodule within the right thyroid. The procedure was planned. The neck was prepped in the usual sterile fashion, and a sterile drape was applied covering the operative field. A timeout was performed  prior to the initiation of the procedure. Local anesthesia was provided with 1% lidocaine. Under direct ultrasound guidance, 6 FNA biopsies were performed of the right thyroid with a 25 gauge needle. Multiple ultrasound images were saved for procedural documentation purposes. The samples were prepared and submitted to pathology. Limited post procedural scanning was negative for hematoma or additional complication. Dressings were placed. The patient tolerated the above procedures procedure well without immediate postprocedural complication. FINDINGS: Nodule reference number based on prior diagnostic ultrasound: None Maximum size: 8.0 cm Location: right; Reason for biopsy: patient/referrer request Ultrasound imaging confirms appropriate placement of the needles within the thyroid nodule. IMPRESSION: Technically successful ultrasound guided fine needle aspiration of indeterminate right thyroid nodule. Read by: Brynda Greathouse PA-C Electronically Signed   By: Jacqulynn Cadet M.D.   On: 02/18/2019 12:20   US Thyroid  Result Date: 02/17/2019 CLINICAL DATA:  Thyroid mass, right EXAM: THYROID ULTRASOUND TECHNIQUE: Ultrasound examination of the thyroid gland and adjacent soft tissues was performed. COMPARISON:  CT 02/15/2019 FINDINGS: Parenchymal Echotexture: Normal (left); markedly heterogenous (right) Isthmus: 0.5 cm thickness Right lobe: 8 x 5.8 x 6.9 cm Left lobe: 5.1 x 2.5 x 1.8 cm _________________________________________________________ Estimated total number of nodules >/= 1 cm: 3 Number of spongiform nodules >/=  2 cm not described below (TR1): 0 Number of mixed cystic and solid nodules >/= 1.5 cm not described below (TR2): 0 _________________________________________________________ Right lobe is nearly completely replaced by complex mostly solid mass without calcifications; no appreciable normal right lobe thyroid tissue is evident Left findings: Nodule # 1: Location: Left; Superior Maximum size: 2.1 cm; Other  2 dimensions: 1.5 x 1.2 cm Composition: mixed cystic and solid (1) Echogenicity: isoechoic (1) Shape: not taller-than-wide (0) Margins: smooth (0) Echogenic foci: none (0) ACR TI-RADS total points: 2. ACR TI-RADS risk category: TR2 (2 points). ACR TI-RADS recommendations: This nodule does NOT meet TI-RADS criteria for biopsy or dedicated follow-up. _________________________________________________________ Nodule # 2: Location: Left; Mid Maximum size: 2.4 cm; Other 2 dimensions: 1.9 x 1.7 cm Composition: cystic/almost completely cystic (0) This nodule does NOT meet TI-RADS criteria for biopsy or dedicated follow-up. _________________________________________________________ Nodule # 3: Location: Left; Inferior Maximum size: 1.2 cm; Other 2 dimensions: 1 cm Composition: cystic/almost completely cystic (0) This nodule does NOT meet TI-RADS criteria for biopsy or dedicated follow-up. _________________________________________________________ IMPRESSION: 1. Thyroid mass replacing most of the right lobe. Recommend FNA biopsy. 2. Smaller cystic nodules in the left lobe, none of which meet criteria for biopsy or  dedicated imaging follow-up. The above is in keeping with the ACR TI-RADS recommendations - J Am Coll Radiol 2017;14:587-595. Electronically Signed   By: Lucrezia Europe M.D.   On: 02/17/2019 09:32    Cardiac Studies   I personally read her echo this morning.   1. The left ventricle has low normal systolic function, with an ejection fraction of 50-55%. The cavity size was normal. Left ventricular diastolic function could not be evaluated secondary to atrial fibrillation. 2. Very difficult imaging, no clear WMA but sensitivity reduced based on limitations of the study. 3. Left atrial size was moderately dilated. 4. The mitral valve is grossly normal. 5. The tricuspid valve is grossly normal. Tricuspid valve regurgitation is moderate. 6. The aortic valve is grossly normal. Mild thickening of the aortic  valve. Mild calcification of the aortic valve. Aortic valve regurgitation is mild by color flow Doppler. 7. The inferior vena cava was dilated in size with <50% respiratory variability. 8. The interatrial septum was not assessed.  SUMMARY  Technically difficult study, even with use of echo contrast. In atrial fibrillation with HRs just above 100. With contrast imaging, appears to have low normal EF, but reduced sensitivity limits analysis.  Patient Profile     83 y.o. female with a hx of paroxysmal atrial fibrillation, asthma vs. COPD, recent hospitalization for for UTI with altered mental status who is being followed for the evaluation of atrial fibrillation with rapid ventricular response at the request of Dr. Irine Seal.  Assessment & Plan    1. Atrial fibrillation with RVR -CHA2DS2/VAS Stroke Risk Points=4 at least, or 6 if history of CVA -reportedly on xarelto prior to admission. Had thyroid biopsy yesterday. Awaiting pathology. If no plans for surgery or further invasive procedures, and no active bleeding, could return to rivaroxaban prior to discharge. Likely will need reduced dose (15 mg) based on renal function, when preparing to transition would ask pharmacy for assistance with timing/dosage -reported prior reaction to amiodarone, unclear what this was (per daughter, potentially lung toxicity/alopecia) -on oral diltiazem starting 5/1, received 120 mg yesterday, did require 5 mg/hr diltiazem drip as well. Increasing to 240 mg diltiazem today, about to receive. I suspect that she will do well with this given that her rates now are in the 90s. I would aim for resting HR of 90. Would try to uptitrate orals (either 30 mg PRN short acting diltiazem or increased oral metoprolol) if her heart rate is >90 bpm but less than 120 bpm. Would try not to restart drip unless she is consistently going >120 bpm. -Would continue fractionated metoprolol as well for now to maximize HR control,  and if she is controlled on higher diltiazem dose can stop metoprolol.  -given that she has a mediastinal mass with tracheal deviation, she is a high risk candidate for TEE, and she would need TEE prior to cardioversion as her anticoagulation history is unclear. Therefore, would not plan to cardiovert unless she becomes unstable. Would aim for rate control.  Additional cardiovascular concerns, not update today but for reference.: -her echo images are very difficult. Without contrast, appears that her EF might be reduced, but with use of echo contrast appears it may be low normal function. She reportedly has a history of heart failure, unclear if this is systolic or diastolic -per notes, reportedly has history of cardiac cath/stents. Unknown location, dates, etc. Denies chest pain, initial troponin negative, ECG without acute ischemic changes -reported history of hypertension and hyperlipidemia. Has had some hypotension  with RVR. -would be helpful if we could get records of cath, echo, medications, other testing from [facility] in [place].  This has been attempted, but apparently office closed, unable to get records.  TIME SPENT WITH PATIENT: 15 minutes of direct patient care. More than 50% of that time was spent on coordination of care and counseling regarding medication management of atrial fib.  Buford Dresser, MD, PhD [facility]   For questions or updates, please contact [place] Please consult [email or web address] for contact info under     Signed, Buford Dresser, MD  02/19/2019, 8:51 AM

## 2019-02-19 NOTE — Progress Notes (Addendum)
PROGRESS NOTE    Carla Kemp  XLK:[id number] DOB: 12-20-1935 DOA: 02/15/2019 PCP: [name], MD    Brief Narrative:  HPI per Dr. Larene Pickett Geffert is a 83 y.o. female with medical history significant of recent UTI with altered mental status, paroxysmal atrial fibrillation, asthma, history of goiter, COPD or asthma, who was recently discharged from the hospital after altered mental status with UTI.  She went home and was living with her daughter who now brought her back to [place] with more confusion dementia and generalized weakness.  Patient was found to have atrial fibrillation with rapid ventricular response.  Initially given Cardizem that decrease the heart rate.  She was also found to have oxygen saturation of 89% on room air which later improved to 97% on 2 L.  She was reported to have had intermittent cough.  As part of her evaluation patient had head CT without contrast that showed small 50 mm left parietal lytic bone lesions.  She also had CT chest that showed small to moderate bilateral pleural effusions and right lower lobe consolidation versus atelectasis.  Further find of 30 mm of infrarenal abdominal aneurysm and multiple thoracic compression fractures age indeterminate wide found.  She had a chest x-ray that showed bilateral pleural effusion and possible right mass versus atelectasis.  On the CT chest that was moderate mass-effect on the narrowing of the thoracic inlet trachea with a large abnormal thyroid lobe or mass which was greater than 7 cm invading adjacent soft tissue.  At this point there is suspicion for right-sided goiter versus malignancy.  With the patient's lytic bones thyroid cancer was suspected.  She is confused but airway is protected.  She is not in any respiratory distress at the moment.  COVID-19 test was done which was negative.  Thyroid panel so far negative LDH 681.  She has a creatinine 1.6 with BUN 53 otherwise unknown if this is  chronic.  She has normal white count.  Patient was transferred here with atrial fibrillation and rapid ventricular response.  Suspicion for malignancy so she could have oncology consultation..    Assessment & Plan:   Principal Problem:   A-fib ([place]) Active Problems:   Asthma   Acute lower UTI   Hypoxia   Pleural effusion, bilateral   Goiter   AAA (abdominal aortic aneurysm) (HCC)   Compression fracture of body of thoracic vertebra (HCC)   Thyroid mass   Altered mental status   Pressure injury of skin   Lytic lesion of bone on x-ray   Goals of care, counseling/discussion   Palliative care by specialist  1 paroxysmal A. fib with RVR CHA2DS2VASC SCORE 6 Patient noted to have a history of paroxysmal A. fib on oral Cardizem and Xarelto status post cardioversion x2 per daughter.  Patient presented A. fib with RVR with some confusion and generalized weakness per admitting H&P.  Patient on a Cardizem drip however blood pressure noted to be borderline with a bout of hypotension early the morning of 02/16/2019,  requiring a 500 cc bolus of normal saline per RN.  Heart rates ranging from the 100s to the 120s.  EKG consistent with A. fib.  Magnesium at 2.1.  Potassium at 3.8.  Troponin negative.  TSH and free T4 within normal limits.  2D echo with EF of 50 to 55%, no clear wall motion abnormalities however difficult imaging, moderately dilated left atrial size, grossly normal mitral valve and tricuspid valve and aortic valve.  Patient was  on Lopressor and oral Cardizem with goals of discontinuing Cardizem drip.  Cardizem drip was discontinued the evening of 02/17/2019, however restarted early the morning of 02/18/2019, due to A. fib with RVR per RN.  Continue IV heparin for anticoagulation.  It was noted that patient was on Xarelto prior to admission which we will hold off on resuming for now just in case patient may need some procedures.  Records from [place] were being tried to obtain however  per charge nurse [facility] currently closed and unable to reach anyone.  Oral Cardizem dose has been increased this morning for better rate control.  Hopefully with increased dose of Cardizem and Lopressor will be able to transition off Cardizem drip today.  Cardiology following and appreciate input and recommendations.   2.  Thyroid mass versus large goiter/Lytic lesions on CT head CT with some pressure on the thoracic outlet.  Patient also noted to have what appeared to be lytic lesions and compression fractures from outside scans.  Patient denies any shortness of breath.  Patient denies any difficulty swallowing.  TSH is 4.301.  Free T4 of 0.87.  Patient does state that she may have had a biopsy done on this thyroid nodule however will need to discuss with patient's family.  Unable to find any information on epic.  Patient also noted to have difficulty expressing her words and sentences which is chronic per daughter.  Per daughter patient recently hospitalized noted to be anemic at that time requiring transfusion of 2 units packed red blood cells however no overt bleeding.  UPEP/SPEP pending.  Skeletal survey with lytic lesion left parietal calvarium question lytic metastases versus multiple myeloma, compression thoracic spine fractures could be insufficiency fractures no underlying pathologic lesions are not excluded, consider MRI assessment.  Questionable tiny lytic lesion versus artifact at the mid left femoral diaphysis.  CHF with small bibasilar pleural effusions and basilar atelectasis.  Right thyroid mass with tracheal deviation right to left.  Daughter also states that patient has had this thyroid mass and states that as long as she is swallowing okay and without any difficulties breathing she did not want anything invasive done at this time in terms of the thyroid mass.  Patient has been seen in consultation by oncology and due to mass-effect on trachea oncology recommending biopsy.  Thyroid  ultrasound was obtained which showed thyroid mass replacing most of the right lobe.  Recommend FNA biopsy.  Smaller cystic nodules in the left lobe, none of which meet criteria for biopsy or dedicated imaging follow-up.  Patient seen by palliative care and per palliative care and discussions with family patient is in agreement for FNA to further diagnose thyroid mass and then decision will be made as to further recommendations.  Patient status post ultrasound-guided FNA of thyroid mass with biopsies 02/18/2019 per interventional radiology.  We will hold off on CT of abdomen and pelvis until FNA results are obtained. Myeloma work-up of SPEP, quantitative immunoglobulins, UPEP pending.  Oncology and palliative care following.   3.  Acute encephalopathy On admission patient noted not to be able to give any adequate history.  Felt secondary to acute illness of underlying dementia.  Patient alert this morning.  Patient following commands appropriately.  Patient with an expressive aphasia from prior CVA.  Urinalysis worrisome for UTI.  Urine cultures with multiple species.  Patient on IV Rocephin x3 days.  Discontinue antibiotics.  Continue supportive care.  Follow.   4.  Chronic respiratory failure Felt likely  secondary to asthma and COPD.  No evidence of pneumonia on x-rays.  Mass noted in chest appears to be thyroid mass.  Patient on 2 L nasal cannula which she was on prior to admission.  Follow.  5.  Multiple thoracic compression fractures Age-indeterminate.  Supportive care.  PT/OT.  6.  UTI Urinalysis worrisome for UTI.  Urine cultures with multiple species.  Patient however symptomatic with complaints of dysuria and burning when she wipes prior to admission and early on during the hospitalization.  Patient states dysuria is improving.. Patient has had 3 days of IV Rocephin.  DC antibiotics.   7.  Bilateral pleural effusions Patient with no significant hypoxia.  Follow for now.   8.  Pressure ulcer,  POA Wound care consult.  9.  Abdominal aortic aneurysm Incidental finding on CT scan.  Will likely need close outpatient follow-up.  10.  Hypokalemia Repleted.  11.  History of CVA with expressive aphasia Stable.  Patient was on Xarelto currently on IV heparin which we will continue for now in anticipation of possible procedures.  Once no further procedures will place back on Xarelto..   DVT prophylaxis: Heparin Code Status: DNR Family Communication: Updated daughter via telephone. Disposition Plan: Remain in stepdown unit.  To be determined.   Consultants:   Cardiology: Dr. Harrell Gave 02/16/2019  Oncology: Dr. Lindi Adie 02/16/2019  Palliative care: Kathie Rhodes, NP 02/17/2019  Procedures:   2D echo 02/16/2019  Thyroid ultrasound 02/17/2019  Skeletal survey 02/16/2019  Ultrasound-guided FNA of indeterminate thyroid nodule per Dr. Laurence Ferrari, IR 02/18/2019  Antimicrobials:   IV Rocephin 02/16/2019>>>> 02/19/2019   Subjective: Patient sitting up in bed drinking coffee.  Denies any significant change with shortness of breath.  Denies any chest pain.  Patient back on Cardizem drip as of yesterday.  Dysuria improving.   Objective: Vitals:   02/19/19 0500 02/19/19 0800 02/19/19 0817 02/19/19 0900  BP: (!) 113/55 117/60  116/70  Pulse:    (!) 112  Resp: 18 (!) 21  (!) 23  Temp:   98.7 F (37.1 C)   TempSrc:   Oral   SpO2:    91%  Weight:      Height:        Intake/Output Summary (Last 24 hours) at 02/19/2019 0917 Last data filed at 02/19/2019 0900 Gross per 24 hour  Intake 1623.98 ml  Output 0 ml  Net 1623.98 ml   Filed Weights   02/17/19 0500 02/18/19 0500 02/19/19 0412  Weight: 54.2 kg 58.7 kg 60.3 kg    Examination:  General exam: NAD HEENT: Thyroid mass .  Oropharynx is clear. Respiratory system: Lungs clear to auscultation bilaterally.  Some decreased breath sounds in the bases.  No wheezing, no rhonchi.  Normal respiratory effort. Cardiovascular system:  Irregularly irregular.  No JVD, no murmurs, no rubs, no lower extremity edema.  Gastrointestinal system: Abdomen is soft, nontender, nondistended, positive bowel sounds.  No rebound.  No guarding. Central nervous system: Alert and oriented to self place and time.  Patient with an expressive aphasia which is chronic.  No focal neurological deficits. Extremities: Symmetric 5 x 5 power. Skin: No rashes, lesions or ulcers Psychiatry: Judgement and insight appear normal. Mood & affect appropriate.     Data Reviewed: I have personally reviewed following labs and imaging studies  CBC: Recent Labs  Lab 02/16/19 0047 02/17/19 0300 02/18/19 0449 02/19/19 0250  WBC 9.7 7.4 6.9 8.9  HGB 11.4* 10.6* 10.6* 11.3*  HCT 39.8 36.7 39.1 39.9  MCV 83.1 82.7 84.8 83.5  PLT 175 151 141* 194   Basic Metabolic Panel: Recent Labs  Lab 02/16/19 0047 02/17/19 0300 02/18/19 0449 02/19/19 0250  NA 136 136 133* 132*  K 4.1 3.2* 3.8 3.7  CL 97* 100 100 98  CO2 '26 25 22 23  '$ GLUCOSE 124* 119* 113* 119*  BUN 49* 46* 40* 38*  CREATININE 1.71* 1.36* 1.14* 1.11*  CALCIUM 8.8* 8.3* 8.3* 8.5*  MG 2.1  --   --   --    GFR: Estimated Creatinine Clearance: 34 mL/min (A) (by C-G formula based on SCr of 1.11 mg/dL (H)). Liver Function Tests: Recent Labs  Lab 02/16/19 0047  AST 62*  ALT 152*  ALKPHOS 120  BILITOT 1.4*  PROT 6.3*  ALBUMIN 3.6   No results for input(s): LIPASE, AMYLASE in the last 168 hours. No results for input(s): AMMONIA in the last 168 hours. Coagulation Profile: Recent Labs  Lab 02/16/19 0046  INR 1.6*   Cardiac Enzymes: Recent Labs  Lab 02/16/19 1032  TROPONINI <0.03   BNP (last 3 results) No results for input(s): PROBNP in the last 8760 hours. HbA1C: No results for input(s): HGBA1C in the last 72 hours. CBG: No results for input(s): GLUCAP in the last 168 hours. Lipid Profile: No results for input(s): CHOL, HDL, LDLCALC, TRIG, CHOLHDL, LDLDIRECT in the last 72  hours. Thyroid Function Tests: Recent Labs    02/16/19 1012  TSH 4.301  FREET4 0.87   Anemia Panel: No results for input(s): VITAMINB12, FOLATE, FERRITIN, TIBC, IRON, RETICCTPCT in the last 72 hours. Sepsis Labs: No results for input(s): PROCALCITON, LATICACIDVEN in the last 168 hours.  Recent Results (from the past 240 hour(s))  MRSA PCR Screening     Status: None   Collection Time: 02/15/19 11:43 PM  Result Value Ref Range Status   MRSA by PCR NEGATIVE NEGATIVE Final    Comment:        The GeneXpert MRSA Assay (FDA approved for NASAL specimens only), is one component of a comprehensive MRSA colonization surveillance program. It is not intended to diagnose MRSA infection nor to guide or monitor treatment for MRSA infections. Performed at [facility], [place] [address] [place], [place] [address]   Culture, Urine     Status: Abnormal   Collection Time: 02/16/19  4:03 AM  Result Value Ref Range Status   Specimen Description   Final    URINE, CLEAN CATCH Performed at [facility], [place] [address] [place], [place] [address]    Special Requests   Final    NONE Performed at [facility], [place] [address] [place], [place] [address]    Culture MULTIPLE SPECIES PRESENT, SUGGEST RECOLLECTION (A)  Final   Report Status 02/17/2019 FINAL  Final         Radiology Studies: US Thyroid Fna Each Nodule  Result Date: 02/18/2019 INDICATION: Indeterminate thyroid nodule of the right thyroid. Request is made for fine need aspiration of indeterminate thyroid nodule. EXAM: ULTRASOUND GUIDED FINE NEEDLE ASPIRATION OF INDETERMINATE THYROID NODULE COMPARISON:  US THYROID 02/17/19 MEDICATIONS: 2 mL 1% lidocaine COMPLICATIONS: None immediate. TECHNIQUE: Informed written consent was obtained from the patient after a discussion of the risks, benefits and alternatives to treatment. Questions regarding the procedure were encouraged and answered.  A timeout was performed prior to the initiation of the procedure. Pre-procedural ultrasound scanning demonstrated unchanged size and appearance of the indeterminate nodule within the right thyroid. The procedure was  planned. The neck was prepped in the usual sterile fashion, and a sterile drape was applied covering the operative field. A timeout was performed prior to the initiation of the procedure. Local anesthesia was provided with 1% lidocaine. Under direct ultrasound guidance, 6 FNA biopsies were performed of the right thyroid with a 25 gauge needle. Multiple ultrasound images were saved for procedural documentation purposes. The samples were prepared and submitted to pathology. Limited post procedural scanning was negative for hematoma or additional complication. Dressings were placed. The patient tolerated the above procedures procedure well without immediate postprocedural complication. FINDINGS: Nodule reference number based on prior diagnostic ultrasound: None Maximum size: 8.0 cm Location: right; Reason for biopsy: patient/referrer request Ultrasound imaging confirms appropriate placement of the needles within the thyroid nodule. IMPRESSION: Technically successful ultrasound guided fine needle aspiration of indeterminate right thyroid nodule. Read by: Brynda Greathouse PA-C Electronically Signed   By: Jacqulynn Cadet M.D.   On: 02/18/2019 12:20        Scheduled Meds:  Chlorhexidine Gluconate Cloth  6 each Topical Daily   diltiazem  240 mg Oral Daily   mouth rinse  15 mL Mouth Rinse BID   metoprolol tartrate  12.5 mg Oral Q6H   mometasone-formoterol  2 puff Inhalation BID   Continuous Infusions:  sodium chloride 50 mL/hr at 02/19/19 0900   cefTRIAXone (ROCEPHIN)  IV Stopped (02/18/19 1448)   diltiazem (CARDIZEM) infusion 5 mg/hr (02/19/19 0900)   heparin 850 Units/hr (02/19/19 0438)     LOS: 4 days    Time spent: 40 minutes    Irine Seal, MD [facility]  If 7PM-7AM, please contact night-coverage [email or web address] 02/19/2019, 9:17 AM

## 2019-02-19 NOTE — Progress Notes (Signed)
ANTICOAGULATION CONSULT NOTE - Follow Up Consult  Pharmacy Consult for heparin Indication: atrial fibrillation with RVR  Allergies  Allergen Reactions  . Amiodarone Other (See Comments)    Respiratory issues, hair loss    Patient Measurements: Height: 5\' 2"  (157.5 cm) Weight: 132 lb 15 oz (60.3 kg) IBW/kg (Calculated) : 50.1 Heparin Dosing Weight: 54 kg  Vital Signs: Temp: 97.5 F (36.4 C) (05/02 1213) Temp Source: Oral (05/02 1213) BP: 116/70 (05/02 0900) Pulse Rate: 112 (05/02 0900)  Labs: Recent Labs    02/16/19 2317  02/17/19 0300 02/18/19 0449 02/19/19 0250  HGB  --    < > 10.6* 10.6* 11.3*  HCT  --   --  36.7 39.1 39.9  PLT  --   --  151 141* 162  APTT 82*  --   --   --   --   HEPARINUNFRC  --   --  0.52 0.30 0.25*  CREATININE  --   --  1.36* 1.14* 1.11*   < > = values in this interval not displayed.    Estimated Creatinine Clearance: 34 mL/min (A) (by C-G formula based on SCr of 1.11 mg/dL (H)).   Assessment: Patient's an 83 y.o F transferred to [facility] from [facility] on 4/29 for workup of mass noted on chest CT.  Heparin drip started on admission for afib with RVR.  Significant events:  - 5/1: thyroid mass biopsy. Heparin held at 0900 and resumed back at 1234 post-procedure  Today, 02/19/2019: - heparin level is therapeutic at 0.47 - hgb low but stable, plts 162 - no bleeding documented  Goal of Therapy:  Heparin level 0.3-0.7 units/ml Monitor platelets by anticoagulation protocol: Yes   Plan:  - continue heparin drip at 850 units/hr - daily heparin level - monitor for s/s bleeding   Xavier Fournier P 02/19/2019,12:49 PM

## 2019-02-19 NOTE — Progress Notes (Signed)
ANTICOAGULATION CONSULT NOTE - Follow Up Consult  Pharmacy Consult for heparin Indication: atrial fibrillation with RVR  Allergies  Allergen Reactions  . Amiodarone Other (See Comments)    Respiratory issues, hair loss    Patient Measurements: Height: 5\' 2"  (157.5 cm) Weight: 132 lb 15 oz (60.3 kg) IBW/kg (Calculated) : 50.1 Heparin Dosing Weight: 54 kg  Vital Signs: Temp: 96.7 F (35.9 C) (05/02 0335) Temp Source: Axillary (05/02 0335) BP: 130/58 (05/02 0300) Pulse Rate: 105 (05/02 0300)  Labs: Recent Labs    02/16/19 1032  02/16/19 2317  02/17/19 0300 02/18/19 0449 02/19/19 0250  HGB  --   --   --    < > 10.6* 10.6* 11.3*  HCT  --   --   --   --  36.7 39.1 39.9  PLT  --   --   --   --  151 141* 162  APTT  --   --  82*  --   --   --   --   HEPARINUNFRC  --    < >  --   --  0.52 0.30 0.25*  CREATININE  --   --   --   --  1.36* 1.14* 1.11*  TROPONINI <0.03  --   --   --   --   --   --    < > = values in this interval not displayed.    Estimated Creatinine Clearance: 34 mL/min (A) (by C-G formula based on SCr of 1.11 mg/dL (H)).   Assessment: Patient's an 83 y.o F transferred to [facility] from [facility] on 4/29 for workup of mass noted on chest CT.  Heparin drip started on admission for afib with RVR.  Today, 5/2 - heparin level is subtherapeutic at 0.25 - hgb low but stable, plts 162 - no bleeding documented and no infusion issues per RN - Heme/onc recommends biopsy of thyroid nodule  Goal of Therapy:  Heparin level 0.3-0.7 units/ml Monitor platelets by anticoagulation protocol: Yes   Plan:  - increase heparin drip to 850 units/hr -recheck HL in 8 hours - daily heparin level - monitor for s/s bleeding - if to proceed with biopsy, please advise if/when heparin drip needs to be held for procedure.  Lawana Pai R 02/19/2019,4:20 AM

## 2019-02-20 DIAGNOSIS — I714 Abdominal aortic aneurysm, without rupture: Secondary | ICD-10-CM

## 2019-02-20 LAB — BASIC METABOLIC PANEL
Anion gap: 11 (ref 5–15)
BUN: 30 mg/dL — ABNORMAL HIGH (ref 8–23)
CO2: 22 mmol/L (ref 22–32)
Calcium: 8.5 mg/dL — ABNORMAL LOW (ref 8.9–10.3)
Chloride: 99 mmol/L (ref 98–111)
Creatinine, Ser: 0.99 mg/dL (ref 0.44–1.00)
GFR calc Af Amer: >60 mL/min (ref >60)
GFR calc non Af Amer: 53 mL/min — ABNORMAL LOW (ref >60)
Glucose, Bld: 96 mg/dL (ref 70–99)
Potassium: 4.1 mmol/L (ref 3.5–5.1)
Sodium: 132 mmol/L — ABNORMAL LOW (ref 135–145)

## 2019-02-20 LAB — CBC
HCT: 38.5 % (ref 36.0–46.0)
Hemoglobin: 10.8 g/dL — ABNORMAL LOW (ref 12.0–15.0)
MCH: 23.8 pg — ABNORMAL LOW (ref 26.0–34.0)
MCHC: 28.1 g/dL — ABNORMAL LOW (ref 30.0–36.0)
MCV: 85 fL (ref 80.0–100.0)
Platelets: 148 10*3/uL — ABNORMAL LOW (ref 150–400)
RBC: 4.53 MIL/uL (ref 3.87–5.11)
RDW: 21.6 % — ABNORMAL HIGH (ref 11.5–15.5)
WBC: 7.1 10*3/uL (ref 4.0–10.5)
nRBC: 0.4 % — ABNORMAL HIGH (ref 0.0–0.2)

## 2019-02-20 LAB — HEPARIN LEVEL (UNFRACTIONATED): Heparin Unfractionated: 0.34 IU/mL (ref 0.30–0.70)

## 2019-02-20 LAB — MAGNESIUM: Magnesium: 2 mg/dL (ref 1.7–2.4)

## 2019-02-20 MED ORDER — METOPROLOL TARTRATE 5 MG/5ML IV SOLN: 2.5000 mg | Freq: Four times a day (QID) | INTRAVENOUS | Status: DC | PRN

## 2019-02-20 NOTE — Progress Notes (Signed)
ANTICOAGULATION CONSULT NOTE - Follow Up Consult  Pharmacy Consult for heparin Indication: atrial fibrillation with RVR  Allergies  Allergen Reactions  . Amiodarone Other (See Comments)    Respiratory issues, hair loss    Patient Measurements: Height: 5\' 2"  (157.5 cm) Weight: 130 lb 11.7 oz (59.3 kg) IBW/kg (Calculated) : 50.1 Heparin Dosing Weight: 54 kg  Vital Signs: Temp: 96 F (35.6 C) (05/03 0411) Temp Source: Axillary (05/03 0411) BP: 169/71 (05/03 0500) Pulse Rate: 46 (05/03 0300)  Labs: Recent Labs    02/18/19 0449 02/19/19 0250 02/19/19 1342 02/20/19 0320  HGB 10.6* 11.3*  --  10.8*  HCT 39.1 39.9  --  38.5  PLT 141* 162  --  148*  HEPARINUNFRC 0.30 0.25* 0.47 0.34  CREATININE 1.14* 1.11*  --  0.99    Estimated Creatinine Clearance: 35.2 mL/min (by C-G formula based on SCr of 0.99 mg/dL).   Assessment: Patient's an 83 y.o F transferred to [facility] from [facility] on 4/29 for workup of mass noted on chest CT.  Heparin drip started on admission for afib with RVR.  Significant events:  - 5/1: thyroid mass biopsy. Heparin held at 0900 and resumed back at 1234 post-procedure  Today, 02/20/2019: - heparin level remains therapeutic at 0.34 - hgb low but stable, plts 148 (labile) - no bleeding documented  Goal of Therapy:  Heparin level 0.3-0.7 units/ml Monitor platelets by anticoagulation protocol: Yes   Plan:  - continue heparin drip at 850 units/hr - daily heparin level - monitor for s/s bleeding   Marionna Gonia P 02/20/2019,8:01 AM

## 2019-02-20 NOTE — Progress Notes (Addendum)
PROGRESS NOTE    Carla Kemp  BMW:[id number]  DOB: Aug 23, 1936  DOA: 02/15/2019 PCP: [name], MD  Brief Narrative:   83 year old female with history of paroxysmal A. fib, asthma, goiter, COPD/asthma,? On home O2 who was recently discharged from the hospital after treatment for UTI and altered mental status presented back to [facility] with worsening confusion and generalized weakness.  Patient was hypoxic on presentation there with A. fib/RVR requiring IV Cardizem.  Patient underwent CT head which showed left parietal lytic lesions and she also underwent CT chest showing right lower lobe consolidation/atelectasis with small to moderate bilateral pleural effusions.  CT chest also reported large abnormal thyroid lobe/mass > 7 cm invading adjacent soft tissue and moderate mass-effect narrowing thoracic inlet trachea.  CT also reported 30 mm infrarenal AAA and multiple thoracic compression fractures.  Given lytic lesions on CT head, thyroid cancer was suspected although right-sided goiter is on the differential.  COVID-19 negative.  Patient was transferred here (With O2 2 L nasal cannula and Cardizem drip for A. Fib) for oncology evaluation.    Subjective: Patient has been followed by cardiology. Patient has been transition to oral Cardizem and Cardizem drip discontinued.  She denies any chest pain or shortness of breath.  She apparently was using 2 L nasal cannula at home and currently requiring the same.  No evidence of stridor or wheezing during this hospital stay.  Status post thyroid biopsy on May 1 and on bridging heparin drip.  Objective: Vitals:   02/20/19 1000 02/20/19 1100 02/20/19 1200 02/20/19 1300  BP: 127/70 126/69 126/68 110/70  Pulse:  (!) 45 (!) 49 88  Resp: 20 (!) 28 (!) 23 (!) 22  Temp:      TempSrc:      SpO2:  (!) 89% 96% 98%  Weight:      Height:        Intake/Output Summary (Last 24 hours) at 02/20/2019 1543 Last data filed at 02/20/2019 1300 Gross  per 24 hour  Intake 1495.16 ml  Output 450 ml  Net 1045.16 ml   Filed Weights   02/18/19 0500 02/19/19 0412 02/20/19 0500  Weight: 58.7 kg 60.3 kg 59.3 kg    Physical Examination:  General exam: Appears calm and comfortable  Respiratory system: Clear to auscultation. Respiratory effort normal. Cardiovascular system: S1 & S2 heard, RRR. No JVD, murmurs, rubs, gallops or clicks. No pedal edema. Gastrointestinal system: Abdomen is nondistended, soft and nontender. No organomegaly or masses felt. Normal bowel sounds heard. Central nervous system: Alert and oriented. No focal neurological deficits. Extremities: Symmetric 5 x 5 power. Skin: No rashes, lesions or ulcers Psychiatry: Judgement and insight appear normal. Mood & affect appropriate.     Data Reviewed: I have personally reviewed following labs and imaging studies  CBC: Recent Labs  Lab 02/16/19 0047 02/17/19 0300 02/18/19 0449 02/19/19 0250 02/20/19 0320  WBC 9.7 7.4 6.9 8.9 7.1  HGB 11.4* 10.6* 10.6* 11.3* 10.8*  HCT 39.8 36.7 39.1 39.9 38.5  MCV 83.1 82.7 84.8 83.5 85.0  PLT 175 151 141* 162 272*   Basic Metabolic Panel: Recent Labs  Lab 02/16/19 0047 02/17/19 0300 02/18/19 0449 02/19/19 0250 02/20/19 0320  NA 136 136 133* 132* 132*  K 4.1 3.2* 3.8 3.7 4.1  CL 97* 100 100 98 99  CO2 '26 25 22 23 22  '$ GLUCOSE 124* 119* 113* 119* 96  BUN 49* 46* 40* 38* 30*  CREATININE 1.71* 1.36* 1.14* 1.11* 0.99  CALCIUM 8.8* 8.3*  8.3* 8.5* 8.5*  MG 2.1  --   --   --  2.0   GFR: Estimated Creatinine Clearance: 35.2 mL/min (by C-G formula based on SCr of 0.99 mg/dL). Liver Function Tests: Recent Labs  Lab 02/16/19 0047  AST 62*  ALT 152*  ALKPHOS 120  BILITOT 1.4*  PROT 6.3*  ALBUMIN 3.6   No results for input(s): LIPASE, AMYLASE in the last 168 hours. No results for input(s): AMMONIA in the last 168 hours. Coagulation Profile: Recent Labs  Lab 02/16/19 0046  INR 1.6*   Cardiac Enzymes: Recent Labs   Lab 02/16/19 1032  TROPONINI <0.03   BNP (last 3 results) No results for input(s): PROBNP in the last 8760 hours. HbA1C: No results for input(s): HGBA1C in the last 72 hours. CBG: No results for input(s): GLUCAP in the last 168 hours. Lipid Profile: No results for input(s): CHOL, HDL, LDLCALC, TRIG, CHOLHDL, LDLDIRECT in the last 72 hours. Thyroid Function Tests: No results for input(s): TSH, T4TOTAL, FREET4, T3FREE, THYROIDAB in the last 72 hours. Anemia Panel: No results for input(s): VITAMINB12, FOLATE, FERRITIN, TIBC, IRON, RETICCTPCT in the last 72 hours. Sepsis Labs: No results for input(s): PROCALCITON, LATICACIDVEN in the last 168 hours.  Recent Results (from the past 240 hour(s))  MRSA PCR Screening     Status: None   Collection Time: 02/15/19 11:43 PM  Result Value Ref Range Status   MRSA by PCR NEGATIVE NEGATIVE Final    Comment:        The GeneXpert MRSA Assay (FDA approved for NASAL specimens only), is one component of a comprehensive MRSA colonization surveillance program. It is not intended to diagnose MRSA infection nor to guide or monitor treatment for MRSA infections. Performed at [facility], [place] [address] [place], [place] [address]   Culture, Urine     Status: Abnormal   Collection Time: 02/16/19  4:03 AM  Result Value Ref Range Status   Specimen Description   Final    URINE, CLEAN CATCH Performed at [facility], [place] [address], [place], [place] [address]    Special Requests   Final    NONE Performed at [facility], [place] [address] [place], [place] [address]    Culture MULTIPLE SPECIES PRESENT, SUGGEST RECOLLECTION (A)  Final   Report Status 02/17/2019 FINAL  Final      Radiology Studies: No results found.      Scheduled Meds: . Chlorhexidine Gluconate Cloth  6 each Topical Daily  . diltiazem  240 mg Oral Daily  . mouth rinse  15 mL Mouth Rinse BID  . metoprolol tartrate   12.5 mg Oral Q6H  . mometasone-formoterol  2 puff Inhalation BID   Continuous Infusions: . sodium chloride 50 mL/hr at 02/20/19 1300  . diltiazem (CARDIZEM) infusion Stopped (02/19/19 1218)  . heparin 850 Units/hr (02/20/19 1300)    Assessment & Plan:    1.  A. fib with RVR: Appreciate cardiology evaluation.  Continue oral Cardizem/metoprolol.  Titrate as needed.  PRN metoprolol available for recurrent episodes as long as blood pressure tolerates. (Please refer to cardiology note from today for other recommendations) If she has unstable A. fib with RVR, may need cardioversion but will require TEE given unclear compliance with anticoagulation as outpatient.  Patient, however, high risk for TEE per cardiology given compressive thyroid mass with tracheal deviation. Echo technically difficult but likely has low normal systolic function per cardiology.  Patient overall understands poor prognosis and  open for palliative care discussions.  2.  Left parietal lytic lesions/thyroid mass with tracheal deviation/mass-effect: No evidence of stridor.  Stable on 2 L nasal cannula.  Status post FNA thyroid biopsy, results pending. TSH, free T4 wnl. Given report of lytic lesions in the left parietal calvarium, multiple myeloma work-up has been sent off.  Seen by oncology.  3. COPD/chronic respiratory failure: Stable on 2 L nasal cannula.  Continue neb treatments PRN.  4. UTI/dysuria: S/p antibiotic treatment  5.  History of CVA with residual expressive aphasia: Patient on Xarelto as outpatient which is currently on hold given invasive procedures.  Remains on IV heparin.  Can likely transition back to Xarelto in a.m. if no further procedures planned and patient chooses palliative care.  6. Bilateral pleural effusions: Not on diuretics due to poor oral intake and risk of dehydration.  DC IV fluids.  She remained stable on 2 L nasal cannula.  7.  Infrarenal AAA: Patient leaning towards palliative care.   Follow-up accordingly  8. Thoracic compression fractures: Symptomatic/supportive management  9.  Poor oral intake/failure to thrive: Likely in the setting of problem #2.    DVT prophylaxis: Heparin Code Status: DNR Family / Patient Communication: Discussed with patient and all questions answered Disposition Plan: To be determined based on palliative care discussions.  Can transfer out of stepdown unit today     LOS: 5 days    Time spent: 35 minutes    Guilford Shi, MD [facility] Pager [phone]  If 7PM-7AM, please contact night-coverage [email or web address] Password [facility] 02/20/2019, 3:43 PM

## 2019-02-20 NOTE — Progress Notes (Signed)
Progress Note  Patient Name: Carla Kemp Date of Encounter: 02/20/2019  Primary Cardiologist: [facility] in [place] comfortably in bed. No acute events overnight  Inpatient Medications    Scheduled Meds:  Chlorhexidine Gluconate Cloth  6 each Topical Daily   diltiazem  240 mg Oral Daily   mouth rinse  15 mL Mouth Rinse BID   metoprolol tartrate  12.5 mg Oral Q6H   mometasone-formoterol  2 puff Inhalation BID   Continuous Infusions:  sodium chloride 50 mL/hr at 02/20/19 0555   diltiazem (CARDIZEM) infusion Stopped (02/19/19 1218)   heparin 850 Units/hr (02/20/19 0552)   PRN Meds: acetaminophen, lip balm, ondansetron **OR** ondansetron (ZOFRAN) IV   Vital Signs    Vitals:   02/20/19 0300 02/20/19 0411 02/20/19 0500 02/20/19 0800  BP: 119/79  (!) 169/71   Pulse: (!) 46     Resp: 17  20   Temp:  (!) 96 F (35.6 C)  (!) 96.2 F (35.7 C)  TempSrc:  Axillary  Axillary  SpO2: 99%     Weight:   59.3 kg   Height:        Intake/Output Summary (Last 24 hours) at 02/20/2019 0835 Last data filed at 02/20/2019 0600 Gross per 24 hour  Intake 1654.84 ml  Output 450 ml  Net 1204.84 ml   Last 3 Weights 02/20/2019 02/19/2019 02/18/2019  Weight (lbs) 130 lb 11.7 oz 132 lb 15 oz 129 lb 6.6 oz  Weight (kg) 59.3 kg 60.3 kg 58.7 kg      Telemetry    Atrial fib, most rates upper 90s/low 100s. Despite charted numbers, no significant bradycardia seen on telemetry - Personally Reviewed  ECG    No new since 4/29- Personally Reviewed  Physical Exam   GEN: Sitting up in bed, [place] in place, in NAD Neck: No JVD appreciated Cardiac: irregularly irregular, no murmurs appreciated. Respiratory: Clear to auscultation bilaterally at upper fields, diminished at bases GI: Soft, nontender, non-distended  MS: No edema; No deformity. Neuro:  Nonfocal  Psych: Normal affect   Labs    Chemistry Recent Labs  Lab 02/16/19 0047  02/18/19 0449 02/19/19 0250  02/20/19 0320  NA 136   < > 133* 132* 132*  K 4.1   < > 3.8 3.7 4.1  CL 97*   < > 100 98 99  CO2 26   < > 22 23 22   GLUCOSE 124*   < > 113* 119* 96  BUN 49*   < > 40* 38* 30*  CREATININE 1.71*   < > 1.14* 1.11* 0.99  CALCIUM 8.8*   < > 8.3* 8.5* 8.5*  PROT 6.3*  --   --   --   --   ALBUMIN 3.6  --   --   --   --   AST 62*  --   --   --   --   ALT 152*  --   --   --   --   ALKPHOS 120  --   --   --   --   BILITOT 1.4*  --   --   --   --   GFRNONAA 28*   < > 45* 47* 53*  GFRAA 32*   < > 52* 54* >60  ANIONGAP 13   < > 11 11 11    < > = values in this interval not displayed.     Hematology Recent Labs  Lab 02/18/19 [phone] 02/19/19 0250  02/20/19 0320  WBC 6.9 8.9 7.1  RBC 4.61 4.78 4.53  HGB 10.6* 11.3* 10.8*  HCT 39.1 39.9 38.5  MCV 84.8 83.5 85.0  MCH 23.0* 23.6* 23.8*  MCHC 27.1* 28.3* 28.1*  RDW 21.4* 21.5* 21.6*  PLT 141* 162 148*    Cardiac Enzymes Recent Labs  Lab 02/16/19 1032  TROPONINI <0.03   No results for input(s): TROPIPOC in the last 168 hours.   BNPNo results for input(s): BNP, PROBNP in the last 168 hours.   DDimer No results for input(s): DDIMER in the last 168 hours.   Radiology    US Thyroid Fna Each Nodule  Result Date: 02/18/2019 INDICATION: Indeterminate thyroid nodule of the right thyroid. Request is made for fine need aspiration of indeterminate thyroid nodule. EXAM: ULTRASOUND GUIDED FINE NEEDLE ASPIRATION OF INDETERMINATE THYROID NODULE COMPARISON:  US THYROID 02/17/19 MEDICATIONS: 2 mL 1% lidocaine COMPLICATIONS: None immediate. TECHNIQUE: Informed written consent was obtained from the patient after a discussion of the risks, benefits and alternatives to treatment. Questions regarding the procedure were encouraged and answered. A timeout was performed prior to the initiation of the procedure. Pre-procedural ultrasound scanning demonstrated unchanged size and appearance of the indeterminate nodule within the right thyroid. The procedure was  planned. The neck was prepped in the usual sterile fashion, and a sterile drape was applied covering the operative field. A timeout was performed prior to the initiation of the procedure. Local anesthesia was provided with 1% lidocaine. Under direct ultrasound guidance, 6 FNA biopsies were performed of the right thyroid with a 25 gauge needle. Multiple ultrasound images were saved for procedural documentation purposes. The samples were prepared and submitted to pathology. Limited post procedural scanning was negative for hematoma or additional complication. Dressings were placed. The patient tolerated the above procedures procedure well without immediate postprocedural complication. FINDINGS: Nodule reference number based on prior diagnostic ultrasound: None Maximum size: 8.0 cm Location: right; Reason for biopsy: patient/referrer request Ultrasound imaging confirms appropriate placement of the needles within the thyroid nodule. IMPRESSION: Technically successful ultrasound guided fine needle aspiration of indeterminate right thyroid nodule. Read by: Brynda Greathouse PA-C Electronically Signed   By: Jacqulynn Cadet M.D.   On: 02/18/2019 12:20    Cardiac Studies   I personally read her echo this morning.   1. The left ventricle has low normal systolic function, with an ejection fraction of 50-55%. The cavity size was normal. Left ventricular diastolic function could not be evaluated secondary to atrial fibrillation. 2. Very difficult imaging, no clear WMA but sensitivity reduced based on limitations of the study. 3. Left atrial size was moderately dilated. 4. The mitral valve is grossly normal. 5. The tricuspid valve is grossly normal. Tricuspid valve regurgitation is moderate. 6. The aortic valve is grossly normal. Mild thickening of the aortic valve. Mild calcification of the aortic valve. Aortic valve regurgitation is mild by color flow Doppler. 7. The inferior vena cava was dilated in size  with <50% respiratory variability. 8. The interatrial septum was not assessed.  SUMMARY  Technically difficult study, even with use of echo contrast. In atrial fibrillation with HRs just above 100. With contrast imaging, appears to have low normal EF, but reduced sensitivity limits analysis.  Patient Profile     83 y.o. female with a hx of paroxysmal atrial fibrillation, asthma vs. COPD, recent hospitalization for for UTI with altered mental status who is being followed for the evaluation of atrial fibrillation with rapid ventricular response at the request of Dr.  Irine Seal.  Assessment & Plan    1. Atrial fibrillation with RVR -CHA2DS2/VAS Stroke Risk Points=6 with reported history of CVA -reportedly on xarelto prior to admission. Had thyroid biopsy 5/1. Awaiting pathology. If no plans for surgery or further invasive procedures, and no active bleeding, could return to rivaroxaban prior to discharge. Likely will need reduced dose (15 mg) based on renal function, when preparing to transition would ask pharmacy for assistance with timing/dosage -reported prior reaction to amiodarone, unclear what this was (per daughter, potentially lung toxicity/alopecia) -on oral diltiazem starting 5/1 with 120 mg dose, then 240 mg dose starting 5/2. Off diltiazem drip now.  -aim for resting HR in the 90s. Would try to uptitrate orals (either 30 mg PRN short acting diltiazem or increased oral metoprolol) if her heart rate is >90 bpm but less than 120 bpm. Would try not to restart drip unless she is consistently going >120 bpm. -Would continue fractionated metoprolol as well for now to maximize HR control, and if she is controlled on higher diltiazem dose can stop metoprolol.  -given that she has a mediastinal mass with tracheal deviation, she is a high risk candidate for TEE, and she would need TEE prior to cardioversion as her anticoagulation history is unclear. Therefore, would not plan to cardiovert  unless she becomes unstable. Would aim for rate control.  Additional cardiovascular concerns, not update today but for reference.: -her echo images are very difficult. Without contrast, appears that her EF might be reduced, but with use of echo contrast appears it may be low normal function. She reportedly has a history of heart failure, unclear if this is systolic or diastolic -per notes, reportedly has history of cardiac cath/stents. Unknown location, dates, etc. Denies chest pain, initial troponin negative, ECG without acute ischemic changes -reported history of hypertension and hyperlipidemia. Has had some hypotension with RVR. -would be helpful if we could get records of cath, echo, medications, other testing from [facility] in [place].  This has been attempted, but apparently office closed, unable to get records.  [facility] will sign off as she is now on oral meds.   Medication Recommendations:  See above. Would use diltiazem 240 mg daily dose as main rate control agent. Can use 30 mg prn short acting diltiazem (max total daily dose of dilt 360 mg) or additional short acting metoprolol PRN for rate control. If she continues to require both diltiazem and metoprolol, total dose of metoprolol can be combined to equivalent daily dose of metoprolol succinate at disharge Other recommendations (labs, testing, etc):  none Follow up as an outpatient:  She follows with [facility] [place] and wishes to return to them  TIME SPENT WITH PATIENT: 15 minutes of direct patient care. More than 50% of that time was spent on coordination of care and counseling regarding medication management of atrial fib.  Buford [place], MD, PhD [facility]   For questions or updates, please contact [place] Please consult [email or web address] for contact info under     Signed, Buford [place], MD  02/20/2019, 8:35 AM

## 2019-02-21 LAB — HEPARIN LEVEL (UNFRACTIONATED): Heparin Unfractionated: 0.38 IU/mL (ref 0.30–0.70)

## 2019-02-21 LAB — CBC
HCT: 37.7 % (ref 36.0–46.0)
Hemoglobin: 10.6 g/dL — ABNORMAL LOW (ref 12.0–15.0)
MCH: 23.7 pg — ABNORMAL LOW (ref 26.0–34.0)
MCHC: 28.1 g/dL — ABNORMAL LOW (ref 30.0–36.0)
MCV: 84.2 fL (ref 80.0–100.0)
Platelets: 159 10*3/uL (ref 150–400)
RBC: 4.48 MIL/uL (ref 3.87–5.11)
RDW: 21.7 % — ABNORMAL HIGH (ref 11.5–15.5)
WBC: 6.6 10*3/uL (ref 4.0–10.5)
nRBC: 0.3 % — ABNORMAL HIGH (ref 0.0–0.2)

## 2019-02-21 NOTE — Progress Notes (Deleted)
Occupational Therapy Treatment Patient Details Name: Carla Kemp MRN: [id number] DOB: [birth year] Today's Date: 02/21/2019    History of present illness 83 yo female admitted to [facility] from [facility] on 4/29 for AMS, weakness, afib. Pt with recent hospitalization for afib with RVR. CT of head reveals L parietal lytic bone lesions, questionable for metastasis. CT chest reveals bilateral pleural effusion, RLL mass vs atelectasis, infrarenal abdominal aneurysm, thoracic compression fractures of unknown age, large thyroid lobe with L tracheal deviation and work up goiter vs malignancy. Pt also with questionable L femoral lesion. PMH includes COPD vs asthma, goiter, dementia, HTN, CVA with expressive aphasia, CKD.       Follow Up Recommendations  Supervision/Assistance - 24 hour;SNF    Equipment Recommendations  None recommended by OT    Recommendations for Other Services      Precautions / Restrictions Precautions Precautions: Fall;Back Precaution Comments: thoracic compression fractures of unknown age, pt severely kyphotic with forward flexion of trunk  Restrictions Weight Bearing Restrictions: No       Mobility Bed Mobility Overal bed mobility: Needs Assistance Bed Mobility: Supine to Sit     Supine to sit: Min assist        Transfers Overall transfer level: Needs assistance Equipment used: 1 person hand held assist Transfers: Sit to/from [facility] Sit to Stand: Mod assist Stand pivot transfers: Min assist            Balance Overall balance assessment: Needs assistance Sitting-balance support: Feet supported;Bilateral upper extremity supported Sitting balance-Leahy Scale: Fair Sitting balance - Comments: able to sit EOB without PT support, forward leaning due to severe kyphosis Postural control: Other (comment)(anterior leaning) Standing balance support: Bilateral upper extremity supported;During functional activity Standing  balance-Leahy Scale: Poor Standing balance comment: reliant on PT and PT aide for standing balance                           ADL either performed or assessed with clinical judgement   ADL Overall ADL's : Needs assistance/impaired Eating/Feeding: Set up                       Toilet Transfer: Moderate assistance;Stand-pivot;BSC;RW;Cueing for sequencing;Cueing for safety   Toileting- Clothing Manipulation and Hygiene: Maximal assistance;Sit to/from stand;Cueing for sequencing;Cueing for safety               Vision Patient Visual Report: No change from baseline            Cognition Arousal/Alertness: Awake/alert Behavior During Therapy: WFL for tasks assessed/performed Overall Cognitive Status: No family/caregiver present to determine baseline cognitive functioning                                 General Comments: follows commands;                   Pertinent Vitals/ Pain       Pain Assessment: Faces Pain Score: 3  Pain Location: back, R heel of foot Pain Descriptors / Indicators: Sore;Discomfort;Grimacing Pain Intervention(s): Limited activity within patient's tolerance;Repositioned     Prior Functioning/Environment              Frequency  Min 2X/week        Progress Toward Goals  OT Goals(current goals can now be found in the care plan section)  Progress towards OT goals: Progressing toward goals  Plan Discharge plan remains appropriate       AM-PAC OT "6 Clicks" Daily Activity     Outcome Measure   Help from another person eating meals?: A Little Help from another person taking care of personal grooming?: A Little Help from another person toileting, which includes using toliet, bedpan, or urinal?: Total Help from another person bathing (including washing, rinsing, drying)?: A Lot Help from another person to put on and taking off regular upper body clothing?: A Lot Help from another person to put on and  taking off regular lower body clothing?: Total 6 Click Score: 12    End of Session    OT Visit Diagnosis: Muscle weakness (generalized) (M62.81)   Activity Tolerance Patient limited by fatigue   Patient Left in bed;with call bell/phone within reach;with bed alarm set   Nurse Communication          Time: 1145-1200 OT Time Calculation (min): 15 min  Charges: OT General Charges $OT Visit: 1 Visit OT Treatments $Self Care/Home Management : 8-22 mins  Kari Baars, OT Acute Rehabilitation Services [phone] Office- [phone]      [place], Edwena Felty D 02/21/2019, 1:04 PM

## 2019-02-21 NOTE — Progress Notes (Signed)
Occupational Therapy Treatment Patient Details Name: Genevie Elman MRN: [id number] DOB: 21-Oct-1935 Today's Date: 02/21/2019    History of present illness 83 yo female admitted to [facility] from [facility] on 4/29 for AMS, weakness, afib. Pt with recent hospitalization for afib with RVR. CT of head reveals L parietal lytic bone lesions, questionable for metastasis. CT chest reveals bilateral pleural effusion, RLL mass vs atelectasis, infrarenal abdominal aneurysm, thoracic compression fractures of unknown age, large thyroid lobe with L tracheal deviation and work up goiter vs malignancy. Pt also with questionable L femoral lesion. PMH includes COPD vs asthma, goiter, dementia, HTN, CVA with expressive aphasia, CKD.    OT comments  Pt agreed to sit EOB with OT with much encouragement but fatigued quickly  Follow Up Recommendations  Supervision/Assistance - 24 hour;SNF    Equipment Recommendations  None recommended by OT    Recommendations for Other Services      Precautions / Restrictions Precautions Precautions: Fall;Back Precaution Comments: thoracic compression fractures of unknown age, pt severely kyphotic with forward flexion of trunk        Mobility Bed Mobility Overal bed mobility: Needs Assistance Bed Mobility: Supine to Sit;Sit to Supine     Supine to sit: Mod assist Sit to supine: Mod assist      Transfers Overall transfer level: Needs assistance Equipment used: 1 person hand held assist Transfers: Sit to/from [employer] Transfers Sit to Stand: Mod assist Stand pivot transfers: Min assist       General transfer comment: did not stand this OT session    Balance Overall balance assessment: Needs assistance Sitting-balance support: Feet supported;Bilateral upper extremity supported Sitting balance-Leahy Scale: Fair Sitting balance - Comments: able to sit EOB without PT support, forward leaning due to severe kyphosis Postural control: (anterior  leaning) Standing balance support: Bilateral upper extremity supported;During functional activity Standing balance-Leahy Scale: Poor Standing balance comment: reliant on PT and PT aide for standing balance                           ADL either performed or assessed with clinical judgement   ADL Overall ADL's : Needs assistance/impaired Eating/Feeding: Set up   Grooming: Minimal assistance;Sitting                   Toilet Transfer: Moderate assistance;Stand-pivot;BSC;RW;Cueing for sequencing;Cueing for safety   Toileting- Clothing Manipulation and Hygiene: Maximal assistance;Sit to/from stand;Cueing for sequencing;Cueing for safety         General ADL Comments: pt agreed to sit EOB with OT.  Pt needed encouragement to sit EOB for simple grooming task.  Pt fatigued VERY quickly     Vision Patient Visual Report: No change from baseline            Cognition Arousal/Alertness: Awake/alert Behavior During Therapy: WFL for tasks assessed/performed Overall Cognitive Status: No family/caregiver present to determine baseline cognitive functioning                                 General Comments: follows commands;                   Pertinent Vitals/ Pain       Pain Assessment: Faces Pain Score: 3  Faces Pain Scale: Hurts a little bit Pain Location: generalized Pain Descriptors / Indicators: Sore;Grimacing Pain Intervention(s): Limited activity within patient's tolerance;Repositioned  Frequency  Min 2X/week        Progress Toward Goals  OT Goals(current goals can now be found in the care plan section)  Progress towards OT goals: Progressing toward goals     Plan Discharge plan remains appropriate       AM-PAC OT "6 Clicks" Daily Activity     Outcome Measure   Help from another person eating meals?: A Little Help from another person taking care of personal grooming?: A Little Help from another person toileting, which  includes using toliet, bedpan, or urinal?: Total Help from another person bathing (including washing, rinsing, drying)?: A Lot Help from another person to put on and taking off regular upper body clothing?: A Lot Help from another person to put on and taking off regular lower body clothing?: Total 6 Click Score: 12    End of Session    OT Visit Diagnosis: Muscle weakness (generalized) (M62.81)   Activity Tolerance Patient limited by fatigue   Patient Left in bed;with call bell/phone within reach;with bed alarm set   Nurse Communication          Time: 1350-1402 OT Time Calculation (min): 12 min  Charges: OT General Charges $OT Visit: 1 Visit OT Treatments $Self Care/Home Management : 8-22 mins  Kari Baars, OT Acute Rehabilitation Services [phone] Office- [place], Edwena Felty D 02/21/2019, 2:42 PM

## 2019-02-21 NOTE — TOC Initial Note (Signed)
Transition of Care [facility]) - Initial/Assessment Note    Patient Details  Name: Carla Kemp MRN: [id number] Date of Birth: November 26, 1935  [facility] CM/SW Contact:    Dessa Phi, RN Phone Number: 02/21/2019, 3:41 PM  Clinical Narrative: Patient has dementia.Spoke to daughter Sherol Dade [phone] agrees to SNF-she will provide SS# tomorrow to  process for SNF.                  Expected Discharge Plan: Skilled Nursing Facility Barriers to Discharge: Continued Medical Work up   Patient Goals and CMS Choice Patient states their goals for this hospitalization and ongoing recovery are:: get rehab CMS Medicare.gov Compare Post Acute Care list provided to:: Patient Represenative (must comment)    Expected Discharge Plan and Services Expected Discharge Plan: [place]   Discharge Planning Services: CM Consult   Living arrangements for the past 2 months: Single Family Home Expected Discharge Date: (unknown)                                    Prior Living Arrangements/Services Living arrangements for the past 2 months: Single Family Home Lives with:: Adult Children Patient language and need for interpreter reviewed:: Yes Do you feel safe going back to the place where you live?: Yes      Need for Family Participation in Patient Care: No (Comment) Care giver support system in place?: Yes (comment)   Criminal Activity/Legal Involvement Pertinent to Current Situation/Hospitalization: No - Comment as needed  Activities of Daily Living Home Assistive Devices/Equipment: Cane (specify quad or straight), Walker (specify type), Dentures (specify type), Eyeglasses, Oxygen, Nebulizer(front wheeled walker, single point cane, upper/lower dentures) ADL Screening (condition at time of admission) Patient's cognitive ability adequate to safely complete daily activities?: Yes Is the patient deaf or have difficulty hearing?: No Does the patient have  difficulty seeing, even when wearing glasses/contacts?: No Does the patient have difficulty concentrating, remembering, or making decisions?: Yes Patient able to express need for assistance with ADLs?: Yes(aphasia) Does the patient have difficulty dressing or bathing?: Yes Independently performs ADLs?: No Communication: Appropriate for developmental age Dressing (OT): Needs assistance Is this a change from baseline?: Pre-admission baseline Grooming: Needs assistance Is this a change from baseline?: Pre-admission baseline Feeding: Needs assistance Is this a change from baseline?: Pre-admission baseline Bathing: Needs assistance Is this a change from baseline?: Pre-admission baseline Toileting: Needs assistance Is this a change from baseline?: Pre-admission baseline In/Out Bed: Needs assistance Is this a change from baseline?: Pre-admission baseline Walks in Home: Needs assistance Is this a change from baseline?: Pre-admission baseline Does the patient have difficulty walking or climbing stairs?: Yes Weakness of Legs: Both Weakness of Arms/Hands: Both  Permission Sought/Granted Permission sought to share information with : Case Manager Permission granted to share information with : Yes, Verbal Permission Granted  Share Information with NAME: Sherol Dade  Permission granted to share info w AGENCY: SNF  Permission granted to share info w Relationship: dtr  Permission granted to share info w Contact Information: [phone]  Emotional Assessment Appearance:: Appears stated age Attitude/Demeanor/Rapport: Engaged Affect (typically observed): Accepting Orientation: : Fluctuating Orientation (Suspected and/or reported Sundowners)(Dementia) Alcohol / Substance Use: Never Used Psych Involvement: No (comment)  Admission diagnosis:  THYROID MASS A FIB W AVR BILAT PLEURAL EFFUSION Patient Active Problem List   Diagnosis Date Noted  . Lytic lesion of bone on x-ray   .  Goals of care,  counseling/discussion   . Palliative care by specialist   . A-fib ([place]) 02/16/2019  . Asthma 02/16/2019  . Acute lower UTI 02/16/2019  . Hypoxia 02/16/2019  . Pleural effusion, bilateral 02/16/2019  . Goiter 02/16/2019  . AAA (abdominal aortic aneurysm) ([place]) 02/16/2019  . Compression fracture of body of thoracic vertebra ([place]) 02/16/2019  . Thyroid mass 02/16/2019  . Altered mental status 02/16/2019  . Pressure injury of skin 02/16/2019   PCP:  [name], MD Pharmacy:   [facility] [address], [place] [address] Phone: [phone] Fax: [phone]     Social Determinants of Health (SDOH) Interventions    Readmission Risk Interventions No flowsheet data found.

## 2019-02-21 NOTE — Progress Notes (Addendum)
PROGRESS NOTE    Carla Kemp  ALP:[id number] DOB: [birth year] DOA: 02/15/2019 PCP: [name], MD    Brief Narrative:  HPI per Dr. Larene Pickett Dible is a 83 y.o. female with medical history significant of recent UTI with altered mental status, paroxysmal atrial fibrillation, asthma, history of goiter, COPD or asthma, who was recently discharged from the hospital after altered mental status with UTI.  She went home and was living with her daughter who now brought her back to [place] with more confusion dementia and generalized weakness.  Patient was found to have atrial fibrillation with rapid ventricular response.  Initially given Cardizem that decrease the heart rate.  She was also found to have oxygen saturation of 89% on room air which later improved to 97% on 2 L.  She was reported to have had intermittent cough.  As part of her evaluation patient had head CT without contrast that showed small 50 mm left parietal lytic bone lesions.  She also had CT chest that showed small to moderate bilateral pleural effusions and right lower lobe consolidation versus atelectasis.  Further find of 30 mm of infrarenal abdominal aneurysm and multiple thoracic compression fractures age indeterminate wide found.  She had a chest x-ray that showed bilateral pleural effusion and possible right mass versus atelectasis.  On the CT chest that was moderate mass-effect on the narrowing of the thoracic inlet trachea with a large abnormal thyroid lobe or mass which was greater than 7 cm invading adjacent soft tissue.  At this point there is suspicion for right-sided goiter versus malignancy.  With the patient's lytic bones thyroid cancer was suspected.  She is confused but airway is protected.  She is not in any respiratory distress at the moment.  COVID-19 test was done which was negative.  Thyroid panel so far negative LDH 681.  She has a creatinine 1.6 with BUN 53 otherwise unknown if this is  chronic.  She has normal white count.  Patient was transferred here with atrial fibrillation and rapid ventricular response.  Suspicion for malignancy so she could have oncology consultation..    Assessment & Plan:   Principal Problem:   A-fib ([place]) Active Problems:   Asthma   Acute lower UTI   Hypoxia   Pleural effusion, bilateral   Goiter   AAA (abdominal aortic aneurysm) (H[facility])   Compression fracture of body of thoracic vertebra ([facility])   Thyroid mass   Altered mental status   Pressure injury of skin   Lytic lesion of bone on x-ray   Goals of care, counseling/discussion   Palliative care by specialist  1 paroxysmal A. fib with RVR CHA2DS2VASC SCORE 6 Patient noted to have a history of paroxysmal A. fib on oral Cardizem and Xarelto status post cardioversion x2 per daughter.  Patient presented A. fib with RVR with some confusion and generalized weakness per admitting H&P.  Patient was on a Cardizem drip however blood pressure noted to be borderline with a bout of hypotension early the morning of 02/16/2019,  requiring a 500 cc bolus of normal saline per RN.  Heart rates ranging from the 100s to the 120s.  EKG consistent with A. fib.  Magnesium at 2.1.  Potassium at 3.8.  Troponin negative.  TSH and free T4 within normal limits.  2D echo with EF of 50 to 55%, no clear wall motion abnormalities however difficult imaging, moderately dilated left atrial size, grossly normal mitral valve and tricuspid valve and aortic valve.  Patient was  placed on Lopressor and oral Cardizem with goals of discontinuing Cardizem drip.  Cardizem drip was discontinued the evening of 02/17/2019, however restarted early the morning of 02/18/2019, due to A. fib with RVR per RN.  Continue IV heparin for anticoagulation.  It was noted that patient was on Xarelto prior to admission which we will hold off on resuming for now just in case patient may need some procedures.  Records from [place] were being tried to obtain  however per charge nurse [facility] currently closed and unable to reach anyone.  Oral Cardizem dose has been increased to 240 mg daily.  Continue current dose of Lopressor 12.5 mg p.o. every 6 hours and likely transition to Lopressor 25 mg twice daily tomorrow.  Off Cardizem drip. Cardiology following and appreciate input and recommendations.   2.  Thyroid mass versus large goiter/Lytic lesions on CT head CT with some pressure on the thoracic outlet.  Patient also noted to have what appeared to be lytic lesions and compression fractures from outside scans.  Patient denies any shortness of breath.  Patient denied any difficulty swallowing.  TSH is 4.301.  Free T4 of 0.87.   Patient does state that she may have had a biopsy done on this thyroid nodule however will need to discuss with patient's family.  Unable to find any information on epic.  Patient also noted to have difficulty expressing her words and sentences which is chronic per daughter.  Per daughter patient recently hospitalized noted to be anemic at that time requiring transfusion of 2 units packed red blood cells however no overt bleeding.  UPEP/SPEP pending.  Skeletal survey with lytic lesion left parietal calvarium question lytic metastases versus multiple myeloma, compression thoracic spine fractures could be insufficiency fractures no underlying pathologic lesions are not excluded, consider MRI assessment.  Questionable tiny lytic lesion versus artifact at the mid left femoral diaphysis.  CHF with small bibasilar pleural effusions and basilar atelectasis.  Right thyroid mass with tracheal deviation right to left.  Daughter also stated that patient has had this thyroid mass and states that as long as she is swallowing okay and without any difficulties breathing she did not want anything invasive done at this time in terms of the thyroid mass.  Patient has been seen in consultation by oncology and due to mass-effect on trachea oncology  recommending biopsy.  Thyroid ultrasound was obtained which showed thyroid mass replacing most of the right lobe.  Recommend FNA biopsy.  Smaller cystic nodules in the left lobe, none of which meet criteria for biopsy or dedicated imaging follow-up.  Patient seen by palliative care and per palliative care and discussions with family patient is in agreement for FNA to further diagnose thyroid mass and then decision will be made as to further recommendations.  Patient status post ultrasound-guided FNA of thyroid mass with biopsies 02/18/2019 per interventional radiology.  We will hold off on CT of abdomen and pelvis until FNA results are obtained. Myeloma work-up of SPEP, quantitative immunoglobulins, UPEP pending.  Oncology and palliative care following.   3.  Acute encephalopathy On admission patient noted not to be able to give any adequate history.  Felt secondary to acute illness of underlying dementia.  Patient alert this morning.  Patient following commands appropriately.  Patient with an expressive aphasia from prior CVA.  Urinalysis worrisome for UTI.  Urine cultures with multiple species.  Patient on IV Rocephin x3 days.  Continue supportive care.  Follow.   4.  Chronic  respiratory failure Felt likely secondary to asthma and COPD.  No evidence of pneumonia on x-rays.  Mass noted in chest appears to be thyroid mass.  Patient on 2 L nasal cannula which she was on prior to admission.  Follow.  5.  Multiple thoracic compression fractures Age-indeterminate.  Supportive care.  PT/OT.  6.  UTI Urinalysis worrisome for UTI.  Urine cultures with multiple species.  Patient was symptomatic with dysuria and burning when she wipes prior to admission and early on in the hospitalization.  Patient status post 3 days IV Rocephin.  Symptomatic improvement.   7.  Bilateral pleural effusions Patient with no significant hypoxia.  Patient with a urine output of 1.2 L over the past 24 hours.  Follow for now.   8.   Pressure ulcer, POA Wound care consult.  9.  Abdominal aortic aneurysm Incidental finding on CT scan.  Will likely need close outpatient follow-up.  10.  Hypokalemia Repleted.  11.  History of CVA with expressive aphasia Stable.  Patient was on Xarelto currently on IV heparin which we will continue for now in anticipation of possible procedures.  Once no further procedures will place back on Xarelto..   DVT prophylaxis: Heparin Code Status: DNR Family Communication: Updated daughter via telephone.  Disposition Plan: Skilled nursing facility versus home with home health.    Consultants:   Cardiology: Dr. Harrell Gave 02/16/2019  Oncology: Dr. Lindi Adie 02/16/2019  Palliative care: Kathie Rhodes, NP 02/17/2019  Procedures:   2D echo 02/16/2019  Thyroid ultrasound 02/17/2019  Skeletal survey 02/16/2019  Ultrasound-guided FNA of indeterminate thyroid nodule per Dr. Laurence Ferrari, IR 02/18/2019  Antimicrobials:   IV Rocephin 02/16/2019>>>> 02/19/2019   Subjective: Patient sleeping however easily arousable.  Patient denies any chest pain.  No significant change with chronic shortness of breath.  On 2 L nasal cannula at home.  Heart rate improved.  Off Cardizem drip.  Dysuria improved.  Patient denies any dysphagia.  No stridor noted.  No difficulty breathing.  Objective: Vitals:   02/20/19 2043 02/20/19 2357 02/21/19 0448 02/21/19 1004  BP: 104/68 107/61 (!) 121/91 95/66  Pulse: 93 76 88   Resp: 18  18   Temp: 98.2 F (36.8 C)  (!) 97.5 F (36.4 C)   TempSrc: Oral  Oral   SpO2: 92% 91% 94%   Weight:      Height:        Intake/Output Summary (Last 24 hours) at 02/21/2019 1120 Last data filed at 02/21/2019 0700 Gross per 24 hour  Intake 1681.29 ml  Output 1200 ml  Net 481.29 ml   Filed Weights   02/18/19 0500 02/19/19 0412 02/20/19 0500  Weight: 58.7 kg 60.3 kg 59.3 kg    Examination:  General exam: NAD HEENT: Thyroid mass .  Oropharynx is clear. Respiratory system: CTAB  no wheezes, no crackles, no rhonchi.  Normal respiratory effort. Cardiovascular system: Irregularly irregular.  No JVD, no murmurs, no rubs, no gallops.  No lower extremity edema.  Gastrointestinal system: Abdomen is soft, nontender, nondistended, positive bowel sounds.  No rebound.  No guarding.  Central nervous system: Alert and oriented to self place and time.  Patient with an expressive aphasia which is chronic.  No focal neurological deficits. Extremities: Symmetric 5 x 5 power. Skin: No rashes, lesions or ulcers Psychiatry: Judgement and insight appear normal. Mood & affect appropriate.     Data Reviewed: I have personally reviewed following labs and imaging studies  CBC: Recent Labs  Lab 02/17/19 0300 02/18/19  9983 02/19/19 0250 02/20/19 0320 02/21/19 0530  WBC 7.4 6.9 8.9 7.1 6.6  HGB 10.6* 10.6* 11.3* 10.8* 10.6*  HCT 36.7 39.1 39.9 38.5 37.7  MCV 82.7 84.8 83.5 85.0 84.2  PLT 151 141* 162 148* 382   Basic Metabolic Panel: Recent Labs  Lab 02/16/19 0047 02/17/19 0300 02/18/19 0449 02/19/19 0250 02/20/19 0320  NA 136 136 133* 132* 132*  K 4.1 3.2* 3.8 3.7 4.1  CL 97* 100 100 98 99  CO2 '26 25 22 23 22  '$ GLUCOSE 124* 119* 113* 119* 96  BUN 49* 46* 40* 38* 30*  CREATININE 1.71* 1.36* 1.14* 1.11* 0.99  CALCIUM 8.8* 8.3* 8.3* 8.5* 8.5*  MG 2.1  --   --   --  2.0   GFR: Estimated Creatinine Clearance: 35.2 mL/min (by C-G formula based on SCr of 0.99 mg/dL). Liver Function Tests: Recent Labs  Lab 02/16/19 0047  AST 62*  ALT 152*  ALKPHOS 120  BILITOT 1.4*  PROT 6.3*  ALBUMIN 3.6   No results for input(s): LIPASE, AMYLASE in the last 168 hours. No results for input(s): AMMONIA in the last 168 hours. Coagulation Profile: Recent Labs  Lab 02/16/19 0046  INR 1.6*   Cardiac Enzymes: Recent Labs  Lab 02/16/19 1032  TROPONINI <0.03   BNP (last 3 results) No results for input(s): PROBNP in the last 8760 hours. HbA1C: No results for input(s): HGBA1C in  the last 72 hours. CBG: No results for input(s): GLUCAP in the last 168 hours. Lipid Profile: No results for input(s): CHOL, HDL, LDLCALC, TRIG, CHOLHDL, LDLDIRECT in the last 72 hours. Thyroid Function Tests: No results for input(s): TSH, T4TOTAL, FREET4, T3FREE, THYROIDAB in the last 72 hours. Anemia Panel: No results for input(s): VITAMINB12, FOLATE, FERRITIN, TIBC, IRON, RETICCTPCT in the last 72 hours. Sepsis Labs: No results for input(s): PROCALCITON, LATICACIDVEN in the last 168 hours.  Recent Results (from the past 240 hour(s))  MRSA PCR Screening     Status: None   Collection Time: 02/15/19 11:43 PM  Result Value Ref Range Status   MRSA by PCR NEGATIVE NEGATIVE Final    Comment:        The GeneXpert MRSA Assay (FDA approved for NASAL specimens only), is one component of a comprehensive MRSA colonization surveillance program. It is not intended to diagnose MRSA infection nor to guide or monitor treatment for MRSA infections. Performed at [facility], [place] [address], [place], [place] [address]   Culture, Urine     Status: Abnormal   Collection Time: 02/16/19  4:03 AM  Result Value Ref Range Status   Specimen Description   Final    URINE, CLEAN CATCH Performed at [facility], [place] [address] [place], [place] [address]    Special Requests   Final    NONE Performed at [facility], [place] [address], [place], [place] [address]    Culture MULTIPLE SPECIES PRESENT, SUGGEST RECOLLECTION (A)  Final   Report Status 02/17/2019 FINAL  Final         Radiology Studies: No results found.      Scheduled Meds:  Chlorhexidine Gluconate Cloth  6 each Topical Daily   diltiazem  240 mg Oral Daily   mouth rinse  15 mL Mouth Rinse BID   metoprolol tartrate  12.5 mg Oral Q6H   mometasone-formoterol  2 puff Inhalation BID   Continuous Infusions:  sodium chloride 50 mL/hr at 02/21/19 0147   diltiazem  (CARDIZEM)  infusion Stopped (02/19/19 1218)   heparin 850 Units/hr (02/21/19 1037)     LOS: 6 days    Time spent: 40 minutes    Irine Seal, MD [facility]  If 7PM-7AM, please contact night-coverage [email or web address] 02/21/2019, 11:20 AM

## 2019-02-21 NOTE — Progress Notes (Signed)
ANTICOAGULATION CONSULT NOTE - Follow Up Consult  Pharmacy Consult for heparin Indication: atrial fibrillation with RVR  Allergies  Allergen Reactions  . Amiodarone Other (See Comments)    Respiratory issues, hair loss    Patient Measurements: Height: 5\' 2"  (157.5 cm) Weight: 130 lb 11.7 oz (59.3 kg) IBW/kg (Calculated) : 50.1 Heparin Dosing Weight: 54 kg  Vital Signs: Temp: 97.5 F (36.4 C) (05/04 0448) Temp Source: Oral (05/04 0448) BP: 121/91 (05/04 0448) Pulse Rate: 88 (05/04 0448)  Labs: Recent Labs    02/19/19 0250 02/19/19 1342 02/20/19 0320 02/21/19 0530  HGB 11.3*  --  10.8* 10.6*  HCT 39.9  --  38.5 37.7  PLT 162  --  148* 159  HEPARINUNFRC 0.25* 0.47 0.34 0.38  CREATININE 1.11*  --  0.99  --     Estimated Creatinine Clearance: 35.2 mL/min (by C-G formula based on SCr of 0.99 mg/dL).   Assessment: Patient's an 83 y.o F transferred to [facility] from [facility] on 4/29 for workup of mass noted on chest CT.  Heparin drip started on admission for afib with RVR.  Significant events:  - 5/1: thyroid mass biopsy. Heparin held at 0900 and resumed back at 1234 post-procedure  Today, 02/21/2019: - heparin level remains therapeutic at 0.38 - hgb low but stable, plts 159 (labile) - no bleeding documented  Goal of Therapy:  Heparin level 0.3-0.7 units/ml Monitor platelets by anticoagulation protocol: Yes   Plan:  - continue heparin drip at 850 units/hr - daily heparin level - monitor for s/s bleeding - [facility] does NOT have any xarelto prescription on file for her  Eudelia Bunch, Pharm.D [phone] 02/21/2019 9:13 AM

## 2019-02-22 LAB — CBC
HCT: 37.5 % (ref 36.0–46.0)
Hemoglobin: 10.5 g/dL — ABNORMAL LOW (ref 12.0–15.0)
MCH: 23.4 pg — ABNORMAL LOW (ref 26.0–34.0)
MCHC: 28 g/dL — ABNORMAL LOW (ref 30.0–36.0)
MCV: 83.5 fL (ref 80.0–100.0)
Platelets: 164 10*3/uL (ref 150–400)
RBC: 4.49 MIL/uL (ref 3.87–5.11)
RDW: 21.3 % — ABNORMAL HIGH (ref 11.5–15.5)
WBC: 5.9 10*3/uL (ref 4.0–10.5)
nRBC: 0 % (ref 0.0–0.2)

## 2019-02-22 LAB — BASIC METABOLIC PANEL
Anion gap: 8 (ref 5–15)
BUN: 26 mg/dL — ABNORMAL HIGH (ref 8–23)
CO2: 23 mmol/L (ref 22–32)
Calcium: 8.7 mg/dL — ABNORMAL LOW (ref 8.9–10.3)
Chloride: 104 mmol/L (ref 98–111)
Creatinine, Ser: 0.9 mg/dL (ref 0.44–1.00)
GFR calc Af Amer: >60 mL/min (ref >60)
GFR calc non Af Amer: 60 mL/min — ABNORMAL LOW (ref >60)
Glucose, Bld: 111 mg/dL — ABNORMAL HIGH (ref 70–99)
Potassium: 4.2 mmol/L (ref 3.5–5.1)
Sodium: 135 mmol/L (ref 135–145)

## 2019-02-22 LAB — HEPARIN LEVEL (UNFRACTIONATED): Heparin Unfractionated: 0.53 IU/mL (ref 0.30–0.70)

## 2019-02-22 MED ORDER — BENZONATATE 100 MG PO CAPS: 100.0000 mg | ORAL_CAPSULE | Freq: Three times a day (TID) | ORAL | Status: DC | PRN

## 2019-02-22 MED ORDER — RIVAROXABAN 15 MG PO TABS
15.0000 mg | ORAL_TABLET | Freq: Every day | ORAL | Status: DC
  Administered 2019-02-22 – 2019-02-27 (×6): 15 mg via ORAL
  Filled 2019-02-22 (×6): qty 1

## 2019-02-22 NOTE — Progress Notes (Signed)
Attempted to call patients daughter and sister today with no answer.

## 2019-02-22 NOTE — TOC Progression Note (Signed)
Transition of Care [facility]) - Progression Note    Patient Details  Name: Layali Freund MRN: [id number] Date of Birth: 04/10/1936  Transition of [facility] CM/SW Contact  Henri Baumler, Juliann Pulse, RN Phone Number: 02/22/2019, 3:03 PM  Clinical Narrative:   Bed offers [place], [facility]-await choice from dtr.    Expected Discharge Plan: [place] Barriers to Discharge: Continued Medical Work up  Expected Discharge Plan and Services Expected Discharge Plan: [place]   Discharge Planning Services: CM Consult   Living arrangements for the past 2 months: Single Family Home Expected Discharge Date: (unknown)                                     Social Determinants of Health (SDOH) Interventions    Readmission Risk Interventions No flowsheet data found.

## 2019-02-22 NOTE — Progress Notes (Signed)
Physical Therapy Treatment Patient Details Name: Carla Kemp MRN: [id number] DOB: 1936/01/25 Today's Date: 02/22/2019    History of Present Illness 83 yo female admitted to [facility] from [facility] on 4/29 for AMS, weakness, afib. Pt with recent hospitalization for afib with RVR. CT of head reveals L parietal lytic bone lesions, questionable for metastasis. CT chest reveals bilateral pleural effusion, RLL mass vs atelectasis, infrarenal abdominal aneurysm, thoracic compression fractures of unknown age, large thyroid lobe with L tracheal deviation and work up goiter vs malignancy. Pt also with questionable L femoral lesion. PMH includes COPD vs asthma, goiter, dementia, HTN, CVA with expressive aphasia, CKD.     PT Comments    Pt appears very weak on today. She was dyspneic and fatigued with minimal activity. Sat EOB for several minutes while working on deep/pursed lip breathing and coughing. O2 sat level fluctuated between 87-91% on [place] O2. Assisted pt back to bed at her request. Will continue to follow.    Follow Up Recommendations  SNF     Equipment Recommendations  None recommended by PT    Recommendations for Other Services       Precautions / Restrictions Precautions Precautions: Fall Precaution Comments: thoracic compression fractures of unknown age, pt severely kyphotic with forward flexion of trunk  Restrictions Weight Bearing Restrictions: No    Mobility  Bed Mobility Overal bed mobility: Needs Assistance Bed Mobility: Supine to Sit;Sit to Supine     Supine to sit: Mod assist;+2 for physical assistance;+2 for safety/equipment Sit to supine: Mod assist;+2 for safety/equipment;+2 for physical assistance   General bed mobility comments: Assist for trunk and bil LEs. Utilized bedpad to assist. Increased time. Cues for safety, technique.   Transfers                 General transfer comment: NT-pt too weak, fatigued, dyspneic. O2 sats fluctuated between  87-91% on O2. Cues for slow, pursed lip breathing and coughing while in sitting.   Ambulation/Gait                 Stairs             Wheelchair Mobility    Modified Rankin (Stroke Patients Only)       Balance Overall balance assessment: Needs assistance Sitting-balance support: Feet supported;Bilateral upper extremity supported Sitting balance-Leahy Scale: Poor Sitting balance - Comments: Pt able to sit without therapist assistance but she relied heavily on bil UE support/grabbing bed sheets.                                     Cognition Arousal/Alertness: Awake/alert Behavior During Therapy: WFL for tasks assessed/performed Overall Cognitive Status: No family/caregiver present to determine baseline cognitive functioning                                 General Comments: follows commands      Exercises      General Comments        Pertinent Vitals/Pain Pain Assessment: Faces Faces Pain Scale: Hurts a little bit Pain Location: generalized Pain Descriptors / Indicators: Grimacing Pain Intervention(s): Limited activity within patient's tolerance;Repositioned    Home Living                      Prior Function  PT Goals (current goals can now be found in the care plan section) Progress towards PT goals: Not progressing toward goals - comment(weak, fatigued, dyspneic with minimal activity)    Frequency    Min 2X/week      PT Plan Current plan remains appropriate    Co-evaluation              AM-PAC PT "6 Clicks" Mobility   Outcome Measure  Help needed turning from your back to your side while in a flat bed without using bedrails?: A Lot Help needed moving from lying on your back to sitting on the side of a flat bed without using bedrails?: A Lot Help needed moving to and from a bed to a chair (including a wheelchair)?: Total Help needed standing up from a chair using your arms (e.g.,  wheelchair or bedside chair)?: Total Help needed to walk in hospital room?: Total Help needed climbing 3-5 steps with a railing? : Total 6 Click Score: 8    End of Session Equipment Utilized During Treatment: Oxygen Activity Tolerance: Patient limited by fatigue Patient left: in bed;with call bell/phone within reach;with bed alarm set   PT Visit Diagnosis: Other abnormalities of gait and mobility (R26.89);Muscle weakness (generalized) (M62.81);Difficulty in walking, not elsewhere classified (R26.2)     Time: 5809-9833 PT Time Calculation (min) (ACUTE ONLY): 19 min  Charges:  $Therapeutic Activity: 8-22 mins                        Weston Anna, PT Acute Rehabilitation Services Pager: [phone] Office: [phone]

## 2019-02-22 NOTE — Discharge Instructions (Signed)

## 2019-02-22 NOTE — NC FL2 (Signed)
[place] LEVEL OF CARE SCREENING TOOL     IDENTIFICATION  Patient Name: Carla Kemp Birthdate: [birth year] Sex: female Admission Date (Current Location): 02/15/2019  [facility] and [place] Number:  Herbalist and Address:         Provider Number: [id number]  Attending Physician Name and Address:  Eugenie Filler, MD  Relative Name and Phone Number:  Sherol Dade [phone]    Current Level of Care: Hospital Recommended Level of Care: [place] Prior Approval Number:    Date Approved/Denied:   PASRR Number:    Discharge Plan: SNF    Current Diagnoses: Patient Active Problem List   Diagnosis Date Noted  . Lytic lesion of bone on x-ray   . Goals of care, counseling/discussion   . Palliative care by specialist   . A-fib ([place]) 02/16/2019  . Asthma 02/16/2019  . Acute lower UTI 02/16/2019  . Hypoxia 02/16/2019  . Pleural effusion, bilateral 02/16/2019  . Goiter 02/16/2019  . AAA (abdominal aortic aneurysm) ([place]) 02/16/2019  . Compression fracture of body of thoracic vertebra ([place]) 02/16/2019  . Thyroid mass 02/16/2019  . Altered mental status 02/16/2019  . Pressure injury of skin 02/16/2019    Orientation RESPIRATION BLADDER Height & Weight     Self, Situation, Place, Time(dementia)  O2(2 & 1/2l [place] continous) External catheter, Incontinent Weight: 61.7 kg Height:  5\' 2"  (157.5 cm)  BEHAVIORAL SYMPTOMS/MOOD NEUROLOGICAL BOWEL NUTRITION STATUS      Continent Diet(heart healthy thin)  AMBULATORY STATUS COMMUNICATION OF NEEDS Skin   Limited Assist Verbally Skin abrasions(sacrum redness,moisture foam dsg)                       Personal Care Assistance Level of Assistance  Bathing, Feeding, Dressing Bathing Assistance: Limited assistance Feeding assistance: Limited assistance Dressing Assistance: Limited assistance     Functional Limitations Info             SPECIAL CARE FACTORS FREQUENCY  PT (By  licensed PT), OT (By licensed OT)     PT Frequency: 5x week OT Frequency: 5x week            Contractures Contractures Info: Not present    Additional Factors Info  Code Status, Allergies Code Status Info: DNR Allergies Info: amiodarone           Current Medications (02/22/2019):  This is the current hospital active medication list Current Facility-Administered Medications  Medication Dose Route Frequency Provider Last Rate Last Dose  . 0.45 % sodium chloride infusion   Intravenous Continuous Elwyn Reach, MD 50 mL/hr at 02/21/19 2126    . acetaminophen (TYLENOL) tablet 650 mg  650 mg Oral Q6H PRN Eugenie Filler, MD   650 mg at 02/21/19 2122  . Chlorhexidine Gluconate Cloth 2 % PADS 6 each  6 each Topical Daily Ivor Costa, MD   6 each at 02/21/19 1005  . diltiazem (CARDIZEM CD) 24 hr capsule 240 mg  240 mg Oral Daily Buford Dresser, MD   240 mg at 02/22/19 9323  . heparin ADULT infusion 100 units/mL (25000 units/242mL sodium chloride 0.45%)  850 Units/hr Intravenous Continuous Eudelia Bunch, RPH 8.5 mL/hr at 02/22/19 1205 850 Units/hr at 02/22/19 1205  . lip balm (CARMEX) ointment   Topical PRN Eugenie Filler, MD      . MEDLINE mouth rinse  15 mL Mouth Rinse BID Eugenie Filler, MD   15 mL at  02/22/19 4497  . metoprolol tartrate (LOPRESSOR) injection 2.5 mg  2.5 mg Intravenous Q6H PRN Guilford Shi, MD      . metoprolol tartrate (LOPRESSOR) tablet 12.5 mg  12.5 mg Oral Q6H Buford Dresser, MD   12.5 mg at 02/22/19 1203  . mometasone-formoterol (DULERA) 100-5 MCG/ACT inhaler 2 puff  2 puff Inhalation BID Eugenie Filler, MD   2 puff at 02/22/19 [phone]  . ondansetron (ZOFRAN) tablet 4 mg  4 mg Oral Q6H PRN Elwyn Reach, MD       Or  . ondansetron (ZOFRAN) injection 4 mg  4 mg Intravenous Q6H PRN Elwyn Reach, MD   4 mg at 02/17/19 0929  . Rivaroxaban (XARELTO) tablet 15 mg  15 mg Oral Q supper Eudelia Bunch, [facility]          Discharge Medications: Please see discharge summary for a list of discharge medications.  Relevant Imaging Results:  Relevant Lab Results:   Additional Information [id number]  Dessa Phi, RN

## 2019-02-22 NOTE — Progress Notes (Signed)
PROGRESS NOTE    Carla Kemp  QXI:[id number] DOB: 1936-01-08 DOA: 02/15/2019 PCP: [name], MD    Brief Narrative:  HPI per Dr. Larene Pickett Perfect is a 83 y.o. female with medical history significant of recent UTI with altered mental status, paroxysmal atrial fibrillation, asthma, history of goiter, COPD or asthma, who was recently discharged from the hospital after altered mental status with UTI.  She went home and was living with her daughter who now brought her back to [place] with more confusion dementia and generalized weakness.  Patient was found to have atrial fibrillation with rapid ventricular response.  Initially given Cardizem that decrease the heart rate.  She was also found to have oxygen saturation of 89% on room air which later improved to 97% on 2 L.  She was reported to have had intermittent cough.  As part of her evaluation patient had head CT without contrast that showed small 50 mm left parietal lytic bone lesions.  She also had CT chest that showed small to moderate bilateral pleural effusions and right lower lobe consolidation versus atelectasis.  Further find of 30 mm of infrarenal abdominal aneurysm and multiple thoracic compression fractures age indeterminate wide found.  She had a chest x-ray that showed bilateral pleural effusion and possible right mass versus atelectasis.  On the CT chest that was moderate mass-effect on the narrowing of the thoracic inlet trachea with a large abnormal thyroid lobe or mass which was greater than 7 cm invading adjacent soft tissue.  At this point there is suspicion for right-sided goiter versus malignancy.  With the patient's lytic bones thyroid cancer was suspected.  She is confused but airway is protected.  She is not in any respiratory distress at the moment.  COVID-19 test was done which was negative.  Thyroid panel so far negative LDH 681.  She has a creatinine 1.6 with BUN 53 otherwise unknown if this is  chronic.  She has normal white count.  Patient was transferred here with atrial fibrillation and rapid ventricular response.  Suspicion for malignancy so she could have oncology consultation..    Assessment & Plan:   Principal Problem:   A-fib ([place]) Active Problems:   Asthma   Acute lower UTI   Hypoxia   Pleural effusion, bilateral   Goiter   AAA (abdominal aortic aneurysm) (HCC)   Compression fracture of body of thoracic vertebra (HCC)   Thyroid mass   Altered mental status   Pressure injury of skin   Lytic lesion of bone on x-ray   Goals of care, counseling/discussion   Palliative care by specialist  1 paroxysmal A. fib with RVR CHA2DS2VASC SCORE 6 Patient noted to have a history of paroxysmal A. fib on oral Cardizem and Xarelto status post cardioversion x2 per daughter.  Patient presented A. fib with RVR with some confusion and generalized weakness per admitting H&P.  Patient was on a Cardizem drip however blood pressure noted to be borderline with a bout of hypotension early the morning of 02/16/2019,  requiring a 500 cc bolus of normal saline per RN.  Heart rates ranging from the 100s to the 120s.  EKG consistent with A. fib.  Magnesium at 2.1.  Potassium at 3.8.  Troponin negative.  TSH and free T4 within normal limits.  2D echo with EF of 50 to 55%, no clear wall motion abnormalities however difficult imaging, moderately dilated left atrial size, grossly normal mitral valve and tricuspid valve and aortic valve.  Patient was  placed on Lopressor and oral Cardizem with goals of discontinuing Cardizem drip.  Cardizem drip was discontinued the evening of 02/17/2019, however restarted early the morning of 02/18/2019, due to A. fib with RVR per RN.  It was noted that patient was on Xarelto prior to admission which we will hold off on resuming for now just in case patient may need some procedures.  Records from [place] were being tried to obtain however per charge nurse [facility]  currently closed and unable to reach anyone.  Oral Cardizem dose has been increased to 240 mg daily.  Continue current dose of Lopressor 12.5 mg p.o. every 6 hours and likely transition to Lopressor 25 mg twice daily tomorrow.  Off Cardizem drip.  DC IV heparin and placed on Xarelto.  Cardiology following and appreciate input and recommendations.   2.  Thyroid mass versus large goiter/Lytic lesions on CT head CT with some pressure on the thoracic outlet.  Patient also noted to have what appeared to be lytic lesions and compression fractures from outside scans.  Patient denies any shortness of breath.  Patient denied any difficulty swallowing.  TSH is 4.301.  Free T4 of 0.87.   Patient does state that she may have had a biopsy done on this thyroid nodule however will need to discuss with patient's family.  Unable to find any information on epic.  Patient also noted to have difficulty expressing her words and sentences which is chronic per daughter.  Per daughter patient recently hospitalized noted to be anemic at that time requiring transfusion of 2 units packed red blood cells however no overt bleeding.  UPEP/SPEP pending.  Skeletal survey with lytic lesion left parietal calvarium question lytic metastases versus multiple myeloma, compression thoracic spine fractures could be insufficiency fractures no underlying pathologic lesions are not excluded, consider MRI assessment.  Questionable tiny lytic lesion versus artifact at the mid left femoral diaphysis.  CHF with small bibasilar pleural effusions and basilar atelectasis.  Right thyroid mass with tracheal deviation right to left.  Daughter also stated that patient has had this thyroid mass and states that as long as she is swallowing okay and without any difficulties breathing she did not want anything invasive done at this time in terms of the thyroid mass.  Patient has been seen in consultation by oncology and due to mass-effect on trachea oncology  recommending biopsy.  Thyroid ultrasound was obtained which showed thyroid mass replacing most of the right lobe.  Recommend FNA biopsy.  Smaller cystic nodules in the left lobe, none of which meet criteria for biopsy or dedicated imaging follow-up.  Patient seen by palliative care and per palliative care and discussions with family patient is in agreement for FNA to further diagnose thyroid mass and then decision will be made as to further recommendations.  Patient status post ultrasound-guided FNA of thyroid mass with biopsies 02/18/2019 per interventional radiology. 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 We will hold off on CT of abdomen and pelvis.  Patient has been seen by oncology and labs reviewed patient noted to have a small amount of IgA lambda with no M protein could be MGUS with normal kappa lambda ratio.  Per oncology not enough suspicion in terms of bone survey to warrant bone marrow biopsy.  Patient will need to follow-up with surgery or endocrinology in the outpatient setting for further evaluation and management.  Palliative care following.    3.  Acute encephalopathy On admission patient noted not to be  able to give any adequate history.  Felt secondary to acute illness of underlying dementia.  Patient alert this morning.  Patient following commands appropriately.  Patient with an expressive aphasia from prior CVA.  Urinalysis worrisome for UTI.  Urine cultures with multiple species.  Patient s/p IV Rocephin x3 days.  Continue supportive care.  Follow.   4.  Chronic respiratory failure Felt likely secondary to asthma and COPD.  No evidence of pneumonia on x-rays.  Mass noted in chest appears to be thyroid mass.  Patient on 2 L nasal cannula which she was on prior to admission.  Follow.  5.  Multiple thoracic compression fractures Age-indeterminate.  Supportive care.  PT/OT.  6.  UTI Urinalysis worrisome for UTI.  Urine cultures with multiple species.  Patient was symptomatic  with dysuria and burning when she wipes prior to admission and early on in the hospitalization.  Patient status post 3 days IV Rocephin.  Symptomatic improvement.   7.  Bilateral pleural effusions Patient with no significant hypoxia.  Patient with a urine output of 900cc over past 24 hours. Follow for now.   8.  Pressure ulcer, POA Wound care consult.  9.  Abdominal aortic aneurysm Incidental finding on CT scan.  Will likely need close outpatient follow-up.  10.  Hypokalemia Repleted.  11.  History of CVA with expressive aphasia Stable.  Patient was on Xarelto currently on IV heparin in anticipation of possible procedures.  Patient seems like she does not want any invasive measures done at this time.  Will discontinue IV heparin and placed back on Xarelto.    DVT prophylaxis: Heparin/Xarelto Code Status: DNR Family Communication: Updated daughter via telephone.  Disposition Plan: Skilled nursing facility when bed available.    Consultants:   Cardiology: Dr. Harrell Gave 02/16/2019  Oncology: Dr. Lindi Adie 02/16/2019  Palliative care: Kathie Rhodes, NP 02/17/2019  Procedures:   2D echo 02/16/2019  Thyroid ultrasound 02/17/2019  Skeletal survey 02/16/2019  Ultrasound-guided FNA of indeterminate thyroid nodule per Dr. Laurence Ferrari, IR 02/18/2019  Antimicrobials:   IV Rocephin 02/16/2019>>>> 02/19/2019   Subjective: Patient laying in bed.  No significant change with shortness of breath.  Patient complaining of cough.  Denies any chest pain.  Denies any difficulty swallowing.  Denies any stridor.  Denies any difficulty breathing.   Objective: Vitals:   02/22/19 0500 02/22/19 0556 02/22/19 0807 02/22/19 0928  BP:  105/60  122/81  Pulse:  63  96  Resp:  18    Temp:  98.4 F (36.9 C)    TempSrc:  Oral    SpO2:  91% 91%   Weight: 61.7 kg     Height:        Intake/Output Summary (Last 24 hours) at 02/22/2019 1134 Last data filed at 02/22/2019 1000 Gross per 24 hour  Intake 1683.32  ml  Output 1100 ml  Net 583.32 ml   Filed Weights   02/19/19 0412 02/20/19 0500 02/22/19 0500  Weight: 60.3 kg 59.3 kg 61.7 kg    Examination:  General exam: NAD HEENT: Thyroid mass .  Oropharynx is clear. Respiratory system: Lungs clear to auscultation bilaterally.  No wheezes, no crackles, no rhonchi.  Normal respiratory effort. Cardiovascular system: Irregularly irregular.  No JVD no murmurs, no rubs, no gallops.  No lower extremity edema.   Gastrointestinal system: Abdomen is soft, nontender, nondistended, positive bowel sounds.  No rebound.  No guarding.  Central nervous system: Alert and oriented to self place and time.  Patient with an expressive  aphasia which is chronic.  No focal neurological deficits. Extremities: Symmetric 5 x 5 power. Skin: No rashes, lesions or ulcers Psychiatry: Judgement and insight appear normal. Mood & affect appropriate.     Data Reviewed: I have personally reviewed following labs and imaging studies  CBC: Recent Labs  Lab 02/18/19 0449 02/19/19 0250 02/20/19 0320 02/21/19 0530 02/22/19 0545  WBC 6.9 8.9 7.1 6.6 5.9  HGB 10.6* 11.3* 10.8* 10.6* 10.5*  HCT 39.1 39.9 38.5 37.7 37.5  MCV 84.8 83.5 85.0 84.2 83.5  PLT 141* 162 148* 159 761   Basic Metabolic Panel: Recent Labs  Lab 02/16/19 0047 02/17/19 0300 02/18/19 0449 02/19/19 0250 02/20/19 0320 02/22/19 0545  NA 136 136 133* 132* 132* 135  K 4.1 3.2* 3.8 3.7 4.1 4.2  CL 97* 100 100 98 99 104  CO2 '26 25 22 23 22 23  '$ GLUCOSE 124* 119* 113* 119* 96 111*  BUN 49* 46* 40* 38* 30* 26*  CREATININE 1.71* 1.36* 1.14* 1.11* 0.99 0.90  CALCIUM 8.8* 8.3* 8.3* 8.5* 8.5* 8.7*  MG 2.1  --   --   --  2.0  --    GFR: Estimated Creatinine Clearance: 42.3 mL/min (by C-G formula based on SCr of 0.9 mg/dL). Liver Function Tests: Recent Labs  Lab 02/16/19 0047  AST 62*  ALT 152*  ALKPHOS 120  BILITOT 1.4*  PROT 6.3*  ALBUMIN 3.6   No results for input(s): LIPASE, AMYLASE in the last  168 hours. No results for input(s): AMMONIA in the last 168 hours. Coagulation Profile: Recent Labs  Lab 02/16/19 0046  INR 1.6*   Cardiac Enzymes: Recent Labs  Lab 02/16/19 1032  TROPONINI <0.03   BNP (last 3 results) No results for input(s): PROBNP in the last 8760 hours. HbA1C: No results for input(s): HGBA1C in the last 72 hours. CBG: No results for input(s): GLUCAP in the last 168 hours. Lipid Profile: No results for input(s): CHOL, HDL, LDLCALC, TRIG, CHOLHDL, LDLDIRECT in the last 72 hours. Thyroid Function Tests: No results for input(s): TSH, T4TOTAL, FREET4, T3FREE, THYROIDAB in the last 72 hours. Anemia Panel: No results for input(s): VITAMINB12, FOLATE, FERRITIN, TIBC, IRON, RETICCTPCT in the last 72 hours. Sepsis Labs: No results for input(s): PROCALCITON, LATICACIDVEN in the last 168 hours.  Recent Results (from the past 240 hour(s))  MRSA PCR Screening     Status: None   Collection Time: 02/15/19 11:43 PM  Result Value Ref Range Status   MRSA by PCR NEGATIVE NEGATIVE Final    Comment:        The GeneXpert MRSA Assay (FDA approved for NASAL specimens only), is one component of a comprehensive MRSA colonization surveillance program. It is not intended to diagnose MRSA infection nor to guide or monitor treatment for MRSA infections. Performed at [facility], [place] [address], [place], [place] [address]   Culture, Urine     Status: Abnormal   Collection Time: 02/16/19  4:03 AM  Result Value Ref Range Status   Specimen Description   Final    URINE, CLEAN CATCH Performed at [facility], [place] [address] [place], [place] [address]    Special Requests   Final    NONE Performed at [facility], [place] [address], [place],  [address]    Culture MULTIPLE SPECIES PRESENT, SUGGEST RECOLLECTION (A)  Final   Report Status 02/17/2019 FINAL  Final         Radiology Studies: No results  found.      Scheduled Meds:  Chlorhexidine Gluconate Cloth  6 each Topical Daily   diltiazem  240 mg Oral Daily   mouth rinse  15 mL Mouth Rinse BID   metoprolol tartrate  12.5 mg Oral Q6H   mometasone-formoterol  2 puff Inhalation BID   rivaroxaban  15 mg Oral Q supper   Continuous Infusions:  sodium chloride 50 mL/hr at 02/21/19 2126   heparin 850 Units/hr (02/21/19 1824)     LOS: 7 days    Time spent: 35 minutes    Irine Seal, MD [facility]  If 7PM-7AM, please contact night-coverage [email or web address] 02/22/2019, 11:34 AM

## 2019-02-22 NOTE — TOC Progression Note (Signed)
Transition of Care [facility]) - Progression Note    Patient Details  Name: Carla Kemp MRN: [id number] Date of Birth: [birth year]  [facility] CM/SW Contact  Saber Dickerman, Juliann Pulse, RN Phone Number: 02/22/2019, 12:15 PM  Clinical Narrative:dtr provided ss#[phone]. Faxed out to SNF await bed offers.Pasrr initiated.   Expected Discharge Plan: [place] Barriers to Discharge: Continued Medical Work up  Expected Discharge Plan and Services Expected Discharge Plan: [place]   Discharge Planning Services: CM Consult   Living arrangements for the past 2 months: Single Family Home Expected Discharge Date: (unknown)                                     Social Determinants of Health (SDOH) Interventions    Readmission Risk Interventions No flowsheet data found.

## 2019-02-22 NOTE — Progress Notes (Addendum)
ANTICOAGULATION CONSULT NOTE - Follow Up Consult  Pharmacy Consult for heparin Indication: atrial fibrillation with RVR  Allergies  Allergen Reactions  . Amiodarone Other (See Comments)    Respiratory issues, hair loss    Patient Measurements: Height: 5\' 2"  (157.5 cm) Weight: 136 lb 0.4 oz (61.7 kg) IBW/kg (Calculated) : 50.1 Heparin Dosing Weight: 54 kg  Vital Signs: Temp: 98.4 F (36.9 C) (05/05 0556) Temp Source: Oral (05/05 0556) BP: 105/60 (05/05 0556) Pulse Rate: 63 (05/05 0556)  Labs: Recent Labs    02/20/19 0320 02/21/19 0530 02/22/19 0545  HGB 10.8* 10.6* 10.5*  HCT 38.5 37.7 37.5  PLT 148* 159 164  HEPARINUNFRC 0.34 0.38 0.53  CREATININE 0.99  --  0.90    Estimated Creatinine Clearance: 42.3 mL/min (by C-G formula based on SCr of 0.9 mg/dL).   Assessment: Patient's an 83 y.o F transferred to [facility] from [facility] on 4/29 for workup of mass noted on chest CT.  Heparin drip started on admission for afib with RVR.  Significant events:  - 5/1: thyroid mass biopsy. Heparin held at 0900 and resumed back at 1234 post-procedure  Today, 02/22/2019: - heparin level remains therapeutic at 0.53 on heparin drip at 850 units/hr - hgb low but stable, plts 154 (labile) - no bleeding documented  Goal of Therapy:  Heparin level 0.3-0.7 units/ml Monitor platelets by anticoagulation protocol: Yes   Plan:  - continue heparin drip at 850 units/hr - daily heparin level - monitor for s/s bleeding - [facility] does NOT have any xarelto prescription on file for her  Eudelia Bunch, Pharm.D 02/22/2019 7:45 AM   Addendum: Transition UFH> xarelto Run heparin drip until 1700, then DC drip and start Xarelto 15 mg qsupper. Pharmacy to sign off. Eudelia Bunch, Pharm.D 02/22/2019 7:58 AM

## 2019-02-23 LAB — NOVEL CORONAVIRUS, NAA (HOSP ORDER, SEND-OUT TO REF LAB; TAT 18-24 HRS): SARS-CoV-2, NAA: NOT DETECTED

## 2019-02-23 MED ORDER — LEVALBUTEROL HCL 0.63 MG/3ML IN NEBU
0.6300 mg | INHALATION_SOLUTION | Freq: Two times a day (BID) | RESPIRATORY_TRACT | Status: DC
  Administered 2019-02-23 – 2019-02-28 (×10): 0.63 mg via RESPIRATORY_TRACT
  Filled 2019-02-23 (×10): qty 3

## 2019-02-23 MED ORDER — LEVALBUTEROL HCL 0.63 MG/3ML IN NEBU
INHALATION_SOLUTION | RESPIRATORY_TRACT | Status: AC
  Filled 2019-02-23: qty 3

## 2019-02-23 NOTE — Care Management Important Message (Signed)
Important Message  Patient Details IM Letter given to [facility] Case Manager to present to the Patient Name: Carla Kemp MRN: [id number] Date of Birth: [birth year]   Medicare Important Message Given:  Yes    Kerin Salen 02/23/2019, 10:42 AM

## 2019-02-23 NOTE — TOC Progression Note (Signed)
Transition of Care [facility]) - Progression Note    Patient Details  Name: Zareah Hunzeker MRN: [id number] Date of Birth: 01-01-1936  Transition of Care [facility]) CM/SW Contact  Summers Buendia, Juliann Pulse, RN Phone Number: 02/23/2019, 10:56 AM  Clinical Narrative:  [place] [facility] chose [facility]-spoke to rep [facility] @ [place] covid neg results.     Expected Discharge Plan: [place] Barriers to Discharge: Continued Medical Work up  Expected Discharge Plan and Services Expected Discharge Plan: [place]   Discharge Planning Services: CM Consult   Living arrangements for the past 2 months: Single Family Home Expected Discharge Date: (unknown)                                     Social Determinants of Health (SDOH) Interventions    Readmission Risk Interventions No flowsheet data found.

## 2019-02-23 NOTE — Progress Notes (Signed)
Occupational Therapy Treatment Patient Details Name: Carla Kemp MRN: [id number] DOB: [birth year] Today's Date: 02/23/2019    History of present illness 83 yo female admitted to [facility] from [facility] on 4/29 for AMS, weakness, afib. Pt with recent hospitalization for afib with RVR. CT of head reveals L parietal lytic bone lesions, questionable for metastasis. CT chest reveals bilateral pleural effusion, RLL mass vs atelectasis, infrarenal abdominal aneurysm, thoracic compression fractures of unknown age, large thyroid lobe with L tracheal deviation and work up goiter vs malignancy. Pt also with questionable L femoral lesion. PMH includes COPD vs asthma, goiter, dementia, HTN, CVA with expressive aphasia, CKD.    OT comments  Pt agreeable to OOB  Follow Up Recommendations  Supervision/Assistance - 24 hour;SNF    Equipment Recommendations  None recommended by OT    Recommendations for Other Services      Precautions / Restrictions Precautions Precautions: Fall Precaution Comments: thoracic compression fractures of unknown age, pt severely kyphotic with forward flexion of trunk  Restrictions Weight Bearing Restrictions: No       Mobility Bed Mobility Overal bed mobility: Needs Assistance Bed Mobility: Supine to Sit     Supine to sit: Mod assist     General bed mobility comments: Assist for trunk and bil LEs. Utilized bedpad to assist. Increased time. Cues for safety, technique.   Transfers Overall transfer level: Needs assistance Equipment used: 1 person hand held assist Transfers: Sit to/from [facility] Sit to Stand: Mod assist Stand pivot transfers: Mod assist       General transfer comment: cues for deep breathes.  Sats 87-91 on oxygen    Balance Overall balance assessment: Needs assistance Sitting-balance support: Feet supported;Bilateral upper extremity supported Sitting balance-Leahy Scale: Poor       Standing balance-Leahy Scale:  Zero                             ADL either performed or assessed with clinical judgement   ADL Overall ADL's : Needs assistance/impaired Eating/Feeding: Set up   Grooming: Wash/dry face;Wash/dry hands;Minimal assistance;Sitting                   Toilet Transfer: Maximal assistance;+2 for safety/equipment;+2 for physical assistance;Squat-pivot;BSC   Toileting- Clothing Manipulation and Hygiene: Maximal assistance;Sit to/from stand;Cueing for sequencing;Cueing for safety         General ADL Comments: pt agreeable to OOB with OT.   Pt very fatigued but did agree.  RN aware     Vision Patient Visual Report: No change from baseline            Cognition Arousal/Alertness: Awake/alert Behavior During Therapy: WFL for tasks assessed/performed Overall Cognitive Status: No family/caregiver present to determine baseline cognitive functioning                                 General Comments: follows commands                   Pertinent Vitals/ Pain       Pain Score: 4  Pain Location: generalized Pain Descriptors / Indicators: Grimacing Pain Intervention(s): Limited activity within patient's tolerance;Repositioned     Prior Functioning/Environment              Frequency  Min 2X/week        Progress Toward Goals  OT Goals(current goals can now be  found in the care plan section)  Progress towards OT goals: Progressing toward goals     Plan Discharge plan remains appropriate       AM-PAC OT "6 Clicks" Daily Activity     Outcome Measure   Help from another person eating meals?: A Little Help from another person taking care of personal grooming?: A Little Help from another person toileting, which includes using toliet, bedpan, or urinal?: Total Help from another person bathing (including washing, rinsing, drying)?: A Lot Help from another person to put on and taking off regular upper body clothing?: A Lot Help from another  person to put on and taking off regular lower body clothing?: Total 6 Click Score: 12    End of Session Equipment Utilized During Treatment: Gait belt  OT Visit Diagnosis: Muscle weakness (generalized) (M62.81)   Activity Tolerance Patient limited by fatigue   Patient Left with call bell/phone within reach;in chair;with chair alarm set   Nurse Communication Mobility status        Time: 0802-2336 OT Time Calculation (min): 30 min  Charges: OT General Charges $OT Visit: 1 Visit OT Treatments $Self Care/Home Management : 23-37 mins  Kari Baars, OT Acute Rehabilitation Services [phone] Office- [place], Edwena Felty D 02/23/2019, 1:36 PM

## 2019-02-23 NOTE — Progress Notes (Signed)
[facility] PROGRESS NOTE  Carla Kemp GQQ:[id number] DOB: 04-01-1936 DOA: 02/15/2019 PCP: [name], MD   Narrative: 83 year old female Paroxysmal A. fib Asthma/COPD History of goiter  Admit with toxic metabolic encephalopathy + A. fib RVR to stepdown on 02/16/2019-found to have enlarged thyroid with pressure at thoracic outlet as well as lytic bone compression fractures  A & Plan A. 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 Lindi Adie saw the patient in consult Palliative care saw the patient in consult in addition Toxic metabolic encephalopathy superimposed on probable prior CVA and underlying multi-infarct dementia Seems to be resolved at this time Chronic respiratory failure Likely secondary to asthma/COPD and splinting secondary to thoracic fractures-supportive care only at this time Continue flutter valve Pyelonephritis Bilateral effusions Repeat CXR in a.m.  AAA Coincidental and would not treat given DNR status and lack of mortality benefit Prior CVA with expressive aphasia     DVT Xarelto  code Status: DNR communication: Long discussion with daughter on telephone today explaining plan of care and likely discharge once stability has been reached disposition Plan: Awaiting negative fluid balance repeat chest x-ray and further hemodynamic stability but likely can discharge in 24 to 48 hours   Burris Matherne, MD  [facility] [place] [email or web address] 7PM-7AM contact night coverage as above 02/23/2019, 3:54 PM  LOS: 8 days   Consultants:   Cardiology: Dr. Harrell Gave 02/16/2019  Oncology: Dr. Lindi Adie  02/16/2019  Palliative care: Kathie Rhodes, NP 02/17/2019  Procedures:   2D echo 02/16/2019  Thyroid ultrasound 02/17/2019  Skeletal survey 02/16/2019  Ultrasound-guided FNA of indeterminate thyroid nodule per Dr. Laurence Ferrari, IR 02/18/2019  Antimicrobials:   IV Rocephin 02/16/2019>>>> 02/19/2019  Interval history/Subjective: Pleasant oriented to some degree but occasionally confused with my discussion with her Does not seem to understand much about what is going on with her Overall no real complaints Nursing reports had to be suctioned somewhat today Uneaten food remains at the bedside   Objective:  Vitals:  Vitals:   02/23/19 1050 02/23/19 1507  BP:  106/62  Pulse:  74  Resp:  19  Temp:  (!) 97.4 F (36.3 C)  SpO2: 96% (!) 65%    Exam:  Awake coherent but somewhat disheveled on nasal oxygen EOMI NCAT No icterus no pallor No crackles no rales Abdomen soft no rebound no guarding No lower extremity edema Frail Power 5/5 Follows commands although easily distractible   I have personally reviewed the folloNeck stiffness headache photophobia stiffwing:  DATA   Labs:  BUN/creatinine 26/0.9 down from 30/0.99  Hemoglobin 10.5 platelet 164  Novel coronavirus negative since 5/5    Scheduled Meds: . diltiazem  240 mg Oral Daily  . levalbuterol  0.63 mg Nebulization BID  . mouth rinse  15 mL Mouth Rinse BID  . metoprolol tartrate  12.5 mg Oral Q6H  . mometasone-formoterol  2 puff Inhalation BID  . rivaroxaban  15 mg Oral Q supper   Continuous Infusions: . sodium chloride 50 mL/hr at 02/23/19 1332    Principal Problem:   A-fib [facility]) Active Problems:   Asthma   Acute lower UTI   Hypoxia   Pleural effusion, bilateral   Goiter   AAA (abdominal aortic  aneurysm) (HCC)   Compression fracture of body of thoracic vertebra (HCC)   Thyroid mass   Altered mental status   Pressure injury of skin   Lytic lesion of bone on x-ray   Goals of care,  counseling/discussion   Palliative care by specialist   LOS: 8 days

## 2019-02-24 ENCOUNTER — Inpatient Hospital Stay (HOSPITAL_COMMUNITY): Payer: Medicare Other

## 2019-02-24 LAB — BASIC METABOLIC PANEL
Anion gap: 8 (ref 5–15)
BUN: 24 mg/dL — ABNORMAL HIGH (ref 8–23)
CO2: 24 mmol/L (ref 22–32)
Calcium: 9.2 mg/dL (ref 8.9–10.3)
Chloride: 102 mmol/L (ref 98–111)
Creatinine, Ser: 0.88 mg/dL (ref 0.44–1.00)
GFR calc Af Amer: >60 mL/min (ref >60)
GFR calc non Af Amer: >60 mL/min (ref >60)
Glucose, Bld: 110 mg/dL — ABNORMAL HIGH (ref 70–99)
Potassium: 4.8 mmol/L (ref 3.5–5.1)
Sodium: 134 mmol/L — ABNORMAL LOW (ref 135–145)

## 2019-02-24 LAB — CBC WITH DIFFERENTIAL/PLATELET
Abs Immature Granulocytes: 0.03 10*3/uL (ref 0.00–0.07)
Basophils Absolute: 0 10*3/uL (ref 0.0–0.1)
Basophils Relative: 1 %
Eosinophils Absolute: 0 10*3/uL (ref 0.0–0.5)
Eosinophils Relative: 1 %
HCT: 40.4 % (ref 36.0–46.0)
Hemoglobin: 11 g/dL — ABNORMAL LOW (ref 12.0–15.0)
Immature Granulocytes: 1 %
Lymphocytes Relative: 16 %
Lymphs Abs: 1 10*3/uL (ref 0.7–4.0)
MCH: 23 pg — ABNORMAL LOW (ref 26.0–34.0)
MCHC: 27.2 g/dL — ABNORMAL LOW (ref 30.0–36.0)
MCV: 84.5 fL (ref 80.0–100.0)
Monocytes Absolute: 0.6 10*3/uL (ref 0.1–1.0)
Monocytes Relative: 9 %
Neutro Abs: 4.8 10*3/uL (ref 1.7–7.7)
Neutrophils Relative %: 72 %
Platelets: 233 10*3/uL (ref 150–400)
RBC: 4.78 MIL/uL (ref 3.87–5.11)
RDW: 21.4 % — ABNORMAL HIGH (ref 11.5–15.5)
WBC: 6.5 10*3/uL (ref 4.0–10.5)
nRBC: 0 % (ref 0.0–0.2)

## 2019-02-24 MED ORDER — FUROSEMIDE 40 MG PO TABS
40.0000 mg | ORAL_TABLET | Freq: Every day | ORAL | Status: AC
  Administered 2019-02-24 – 2019-02-25 (×2): 40 mg via ORAL
  Filled 2019-02-24 (×2): qty 1

## 2019-02-24 NOTE — Progress Notes (Signed)
Patient has had very little PO intake today, even with encouragement from staff. She has been sleeping comfortably for most of the day. Update given to her daughter Nevin Bloodgood, and she stated that the patient normally does not eat very much while in the hospital.   Esther Hardy, Fraser Din

## 2019-02-24 NOTE — Progress Notes (Addendum)
[facility] PROGRESS NOTE  Carla Kemp [id number] DOB: 1936-10-18 DOA: 02/15/2019 PCP: Carla Ready, MD  Narrative:  83 year old female Paroxysmal A. fib Asthma/COPD History of goiter  Admit with toxic metabolic encephalopathy + A. fib RVR to stepdown on 02/16/2019-found to have enlarged thyroid with pressure at thoracic outlet as well as lytic bone compression fractures  A & Plan  A. fib with RVR on admission, chads score 6--initially on Cardizem drip cardiology has signed off Continue current Cardizem 240, metoprolol 12.5 every 6 PRN IV metoprolol for heart rate sustained above 120-keep on telemetry overnight I/O 6 L positive weight is up to 62 kg Starting Lasix at this time and will need adjustment of the same over the next 1 to 3 days Acute hypoxic respiratory failure superimposed on Chronic respiratory failure--had been on oxygen at home ~ 2 years Iatrogenic fluid overload without heart failure Bilateral effusions Likely secondary to asthma/COPD and splinting secondary to thoracic fractures-supportive care only at this time Continue flutter valve Chest x-ray as below Diuresis over the next several days 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 Lindi Adie saw the patient in consult Palliative care saw the patient in consult in addition Toxic metabolic encephalopathy superimposed on probable prior CVA and underlying multi-infarct dementia Seems to be resolved at this time AAA Coincidental and would not treat given DNR status and lack of mortality benefit Prior CVA with expressive aphasia Pyelonephritis--resolved     DVT :Xarelto   Code Status: DNR communication:d/w daughter in detail on phone--would want palliative involvement in the OP setting  Disposition Plan: Not [facility] for discharge needs diuresis SNF may be in 48 hours?   [name], MD  [facility] [email or web address] 7PM-7AM contact  night coverage as above 02/24/2019, 9:36 AM  LOS: 9 days   Consultants:   Cardiology: Dr. Harrell Gave 02/16/2019  Oncology: Dr. Lindi Adie 02/16/2019  Palliative care: Kathie Rhodes, NP 02/17/2019  Procedures:   2D echo 02/16/2019  Thyroid ultrasound 02/17/2019  Skeletal survey 02/16/2019  Ultrasound-guided FNA of indeterminate thyroid nodule per Dr. Laurence Ferrari, IR 02/18/2019  Antimicrobials:   IV Rocephin 02/16/2019>>>> 02/19/2019  Interval history/Subjective:  Confused-orients some No fever no chills  Objective:  Vitals:  Vitals:   02/23/19 2356 02/24/19 0616  BP: 132/87 111/69  Pulse: (!) 159 99  Resp:  16  Temp: 98.3 F (36.8 C) (!) 97.3 F (36.3 C)  SpO2:  94%    Exam:  Awake coherent on nasal oxygen EOMI NCAT No crackles no rales Abdomen soft no rebound no guarding No lower extremity edema Power 5/5  easily distractible   I have personally reviewed the folloNeck stiffness headache photophobia stiffwing:  DATA  Labs:  BUN/creatinine 26/0.9 down from 30/0.99  Hemoglobin 10.5 platelet 164  Novel coronavirus negative since 5/5  CXR 5/8 1. Moderate right and small to moderate left pleural effusions, increased from prior with bibasilar atelectasis or consolidation. 2. Cardiomegaly and persistent mild interstitial edema.    Scheduled Meds: . diltiazem  240 mg Oral Daily  . furosemide  40 mg Oral Daily  . levalbuterol  0.63 mg Nebulization BID  . mouth rinse  15 mL Mouth Rinse BID  . metoprolol tartrate  12.5 mg Oral Q6H  . mometasone-formoterol  2 puff Inhalation BID  . rivaroxaban  15 mg Oral Q supper   Continuous Infusions:   Principal Problem:   A-fib (HCC) Active Problems:   Asthma   Acute lower UTI   Hypoxia  Pleural effusion, bilateral   Goiter   AAA (abdominal aortic aneurysm) (HCC)   Compression fracture of body of thoracic vertebra (HCC)   Thyroid mass   Altered mental status   Pressure injury of skin   Lytic lesion of bone  on x-ray   Goals of care, counseling/discussion   Palliative care by specialist   LOS: 9 days

## 2019-02-24 NOTE — Progress Notes (Signed)
Physical Therapy Treatment Patient Details Name: Carla Kemp MRN: [id number] DOB: 1936-09-28 Today's Date: 02/24/2019    History of Present Illness 83 yo female admitted to [facility] from [facility] on 4/29 for AMS, weakness, afib. Pt with recent hospitalization for afib with RVR. CT of head reveals L parietal lytic bone lesions, questionable for metastasis. CT chest reveals bilateral pleural effusion, RLL mass vs atelectasis, infrarenal abdominal aneurysm, thoracic compression fractures of unknown age, large thyroid lobe with L tracheal deviation and work up goiter vs malignancy. Pt also with questionable L femoral lesion. PMH includes COPD vs asthma, goiter, dementia, HTN, CVA with expressive aphasia, CKD.     PT Comments    Assisted OOB to [facility] only due to weakness/fatigue.    Follow Up Recommendations  SNF     Equipment Recommendations  None recommended by PT    Recommendations for Other Services       Precautions / Restrictions Precautions Precautions: Fall Precaution Comments: thoracic compression fractures of unknown age, pt severely kyphotic with forward flexion of trunk  Restrictions Weight Bearing Restrictions: No    Mobility  Bed Mobility Overal bed mobility: Needs Assistance Bed Mobility: Supine to Sit;Sit to Supine     Supine to sit: Max assist;Total assist Sit to supine: Max assist;Total assist   General bed mobility comments: Assist for trunk and bil LEs. Utilized bedpad to assist. Increased time.   Transfers Overall transfer level: Needs assistance Equipment used: 1 person hand held assist Transfers: Sit to/from [facility] Sit to Stand: Mod assist Stand pivot transfers: Mod assist;Max assist       General transfer comment: assisted OOB to [facility] with much effort.  Poor standing balnce/posture.  Unsteady.    Ambulation/Gait             General Gait Details: unable to take functional steps.  Transfer only.     Stairs              Wheelchair Mobility    Modified Rankin (Stroke Patients Only)       Balance                                            Cognition Arousal/Alertness: Awake/alert Behavior During Therapy: WFL for tasks assessed/performed Overall Cognitive Status: No family/caregiver present to determine baseline cognitive functioning                                 General Comments: follows commands, soft spoken       Exercises      General Comments        Pertinent Vitals/Pain Pain Assessment: Faces Pain Location: generalized    Home Living                      Prior Function            PT Goals (current goals can now be found in the care plan section) Progress towards PT goals: Progressing toward goals    Frequency    Min 2X/week      PT Plan Current plan remains appropriate    Co-evaluation              AM-PAC PT "6 Clicks" Mobility   Outcome Measure  Help needed turning from your back to your  side while in a flat bed without using bedrails?: A Lot Help needed moving from lying on your back to sitting on the side of a flat bed without using bedrails?: A Lot Help needed moving to and from a bed to a chair (including a wheelchair)?: Total Help needed standing up from a chair using your arms (e.g., wheelchair or bedside chair)?: Total Help needed to walk in hospital room?: Total Help needed climbing 3-5 steps with a railing? : Total 6 Click Score: 8    End of Session Equipment Utilized During Treatment: Oxygen Activity Tolerance: Patient limited by fatigue Patient left: in bed;with bed alarm set;with call bell/phone within reach Nurse Communication: Mobility status PT Visit Diagnosis: Other abnormalities of gait and mobility (R26.89);Muscle weakness (generalized) (M62.81);Difficulty in walking, not elsewhere classified (R26.2)     Time: 1561-5379 PT Time Calculation (min) (ACUTE ONLY): 25  min  Charges:  $Therapeutic Activity: 23-37 mins                     Rica Koyanagi  PTA Acute  Rehabilitation Services Pager      [phone] Office      [phone]

## 2019-02-25 MED ORDER — SODIUM CHLORIDE 0.9 % IV BOLUS
250.0000 mL | Freq: Once | INTRAVENOUS | Status: AC
  Administered 2019-02-25: 18:00:00 250 mL via INTRAVENOUS

## 2019-02-25 MED ORDER — FUROSEMIDE 10 MG/ML IJ SOLN
40.0000 mg | Freq: Every day | INTRAMUSCULAR | Status: DC
  Administered 2019-02-25: 40 mg via INTRAVENOUS
  Filled 2019-02-25: qty 4

## 2019-02-25 MED ORDER — METOPROLOL SUCCINATE ER 25 MG PO TB24: 12.5000 mg | ORAL_TABLET | Freq: Two times a day (BID) | ORAL | Status: DC

## 2019-02-25 NOTE — Progress Notes (Addendum)
[facility] PROGRESS NOTE  Sundeep Cary [id number]:[id number] DOB: [birth year] DOA: 02/15/2019 PCP: [name], MD  Narrative:  83 year old female Paroxysmal A. fib Asthma/COPD History of goiter  Admit with toxic metabolic encephalopathy + A. fib RVR to stepdown on 02/16/2019-found to have enlarged thyroid with pressure at thoracic outlet as well as lytic bone compression fractures  A & Plan  A. fib with RVR on admission, chads score 6--initially on Cardizem drip cardiology has signed off Continue current Cardizem 240,  metoprolol 12.5 q 6-->consolidate Toprol 25 bid PRN IV metoprolol for heart rate sustained above 120-keep on telemetry Addend-Hypotensive after lasix--Might need to give IV saline bolus 250 cc if persists Acute hypoxic respiratory failure superimposed on Chronic respiratory failure--had been on oxygen at home ~ 2 years Iatrogenic fluid overload without heart failure Bilateral effusions Likely secondary to asthma/COPD and splinting secondary to thoracic fractures-supportive care only at this time Non compliant-removed pitcher--cannot eat so challenging with salt restrictions Continue flutter valve Chang PO to IV lasix as patient >6liters + 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 Lindi Adie saw the patient in consult Palliative care saw the patient in consult in addition Toxic metabolic encephalopathy superimposed on probable prior CVA and underlying multi-infarct dementia Seems to be resolved at this time AAA Coincidental and would not treat given DNR status and lack of mortality benefit Prior CVA with expressive aphasia Pyelonephritis--resolved     DVT :Xarelto   Code Status: DNR communication:called paula today and updated  Disposition Plan: will be here diuresing over weekend   [name], MD  [facility] Via [employer] app [place] [email or web address] 7PM-7AM contact night coverage as above 02/25/2019, 2:59 PM   LOS: 10 days   Consultants:   Cardiology: Dr. Harrell Gave 02/16/2019  Oncology: Dr. Lindi Adie 02/16/2019  Palliative care: Kathie Rhodes, NP 02/17/2019  Procedures:   2D echo 02/16/2019  Thyroid ultrasound 02/17/2019  Skeletal survey 02/16/2019  Ultrasound-guided FNA of indeterminate thyroid nodule per Dr. Laurence Ferrari, IR 02/18/2019  Antimicrobials:   IV Rocephin 02/16/2019>>>> 02/19/2019  Interval history/Subjective:  Eating little Drinking ++ oob -->chair ~ 4 hr No cop No fever Doesn't like the food "I need more salt"  Objective:  Vitals:  Vitals:   02/25/19 1038 02/25/19 1356  BP: 117/76 92/72  Pulse:  98  Resp:  (!) 22  Temp:  97.6 F (36.4 C)  SpO2:  94%    Exam:  Awake coherent on nasal oxygen EOMI NCAT No crackles no rales Abdomen soft no rebound no guarding No lower extremity edema Power 5/5    I have personally reviewed the folloNeck stiffness headache photophobia stiffwing:  DATA  Labs:  Stable labs  CXR 5/8 1. Moderate right and small to moderate left pleural effusions, increased from prior with bibasilar atelectasis or consolidation. 2. Cardiomegaly and persistent mild interstitial edema.    Scheduled Meds: . diltiazem  240 mg Oral Daily  . levalbuterol  0.63 mg Nebulization BID  . mouth rinse  15 mL Mouth Rinse BID  . metoprolol tartrate  12.5 mg Oral Q6H  . mometasone-formoterol  2 puff Inhalation BID  . rivaroxaban  15 mg Oral Q supper   Continuous Infusions:   Principal Problem:   A-fib (HCC) Active Problems:   Asthma   Acute lower UTI   Hypoxia   Pleural effusion, bilateral   Goiter   AAA (abdominal aortic aneurysm) (HCC)   Compression fracture of body of thoracic vertebra (HCC)   Thyroid mass   Altered  mental status   Pressure injury of skin   Lytic lesion of bone on x-ray   Goals of care, counseling/discussion   Palliative care by specialist   LOS: 10 days

## 2019-02-26 LAB — CBC WITH DIFFERENTIAL/PLATELET
Abs Immature Granulocytes: 0.02 10*3/uL (ref 0.00–0.07)
Basophils Absolute: 0.1 10*3/uL (ref 0.0–0.1)
Basophils Relative: 1 %
Eosinophils Absolute: 0.1 10*3/uL (ref 0.0–0.5)
Eosinophils Relative: 2 %
HCT: 40.6 % (ref 36.0–46.0)
Hemoglobin: 11.3 g/dL — ABNORMAL LOW (ref 12.0–15.0)
Immature Granulocytes: 0 %
Lymphocytes Relative: 19 %
Lymphs Abs: 1.1 10*3/uL (ref 0.7–4.0)
MCH: 23.3 pg — ABNORMAL LOW (ref 26.0–34.0)
MCHC: 27.8 g/dL — ABNORMAL LOW (ref 30.0–36.0)
MCV: 83.5 fL (ref 80.0–100.0)
Monocytes Absolute: 0.5 10*3/uL (ref 0.1–1.0)
Monocytes Relative: 9 %
Neutro Abs: 4.2 10*3/uL (ref 1.7–7.7)
Neutrophils Relative %: 69 %
Platelets: 243 10*3/uL (ref 150–400)
RBC: 4.86 MIL/uL (ref 3.87–5.11)
RDW: 21.2 % — ABNORMAL HIGH (ref 11.5–15.5)
WBC: 6 10*3/uL (ref 4.0–10.5)
nRBC: 0 % (ref 0.0–0.2)

## 2019-02-26 LAB — RENAL FUNCTION PANEL
Albumin: 3.3 g/dL — ABNORMAL LOW (ref 3.5–5.0)
Anion gap: 11 (ref 5–15)
BUN: 27 mg/dL — ABNORMAL HIGH (ref 8–23)
CO2: 28 mmol/L (ref 22–32)
Calcium: 8.8 mg/dL — ABNORMAL LOW (ref 8.9–10.3)
Chloride: 99 mmol/L (ref 98–111)
Creatinine, Ser: 0.97 mg/dL (ref 0.44–1.00)
GFR calc Af Amer: >60 mL/min (ref >60)
GFR calc non Af Amer: 55 mL/min — ABNORMAL LOW (ref >60)
Glucose, Bld: 104 mg/dL — ABNORMAL HIGH (ref 70–99)
Phosphorus: 4.5 mg/dL (ref 2.5–4.6)
Potassium: 3.8 mmol/L (ref 3.5–5.1)
Sodium: 138 mmol/L (ref 135–145)

## 2019-02-26 NOTE — Progress Notes (Addendum)
[facility] PROGRESS NOTE  Carla Kemp [id number]:[id number] DOB: [birth year] DOA: 02/15/2019 PCP: [name], MD  Narrative:  83 year old female Paroxysmal A. fib Asthma/COPD History of goiter  Admit with toxic metabolic encephalopathy + A. fib RVR to stepdown on 02/16/2019-found to have enlarged thyroid with pressure at thoracic outlet as well as lytic bone compression fractures  A & Plan  A. fib with RVR on admission, chads score 6--initially on Cardizem drip cardiology has signed off Continue current Cardizem 240,  metoprolol 12.5 q 6--> Patient became hypotensive and was not able to tolerate consolidation of metoprolol dosing Patient now only on Cardizem 240 daily and we will have to tolerate some tachycardia and monitor Acute hypoxic respiratory failure superimposed on Chronic respiratory failure--had been on oxygen at home ~ 2 years Iatrogenic fluid overload without heart failure Bilateral effusions Likely secondary to asthma/COPD and splinting secondary to thoracic fractures-supportive care only at this time Non compliant-removed pitcher--cannot eat so challenging with salt restrictions Patient has some urine output today but is 2 L positive still and will need further diuresis gently  and so Lasix has been completely discontinued given hypotension as above cxr in am 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 Lindi Adie saw the patient in consult Palliative care saw the patient in consult in addition Toxic metabolic encephalopathy superimposed on probable prior CVA and underlying multi-infarct dementia Seems to be resolved at this time AAA Coincidental and would not treat given DNR status and lack of mortality benefit Prior CVA with expressive aphasia  Pyelonephritis--resolved     DVT :Xarelto   Code Status: DNR communication: called family 5/8 Disposition Plan: will be here diuresing over weekend   [name],  MD  [facility] [employer] app [place] [email or web address] 7PM-7AM contact night coverage as above 02/26/2019, 5:04 PM  LOS: 11 days   Consultants:   Cardiology: Dr. Harrell Gave 02/16/2019  Oncology: Dr. Lindi Adie 02/16/2019  Palliative care: Kathie Rhodes, NP 02/17/2019  Procedures:   2D echo 02/16/2019  Thyroid ultrasound 02/17/2019  Skeletal survey 02/16/2019  Ultrasound-guided FNA of indeterminate thyroid nodule per Dr. Laurence Ferrari, IR 02/18/2019  Antimicrobials:   IV Rocephin 02/16/2019>>>> 02/19/2019  Interval history/Subjective:  Very poor appetite More coherent however today and able to sit up in the bed No nausea no vomiting at this time No chest pain  Objective:  Vitals:  Vitals:   02/26/19 0924 02/26/19 1309  BP: 100/64 104/67  Pulse: 94 (!) 102  Resp: 18 18  Temp: 97.8 F (36.6 C) 97.9 F (36.6 C)  SpO2: 95% 92%    Exam:  More coherent today remains on oxygen EOMI NCAT No crackles no rales Abdomen soft no rebound no guarding No lower extremity edema Power 5/5    I have personally reviewed the folloNeck stiffness headache photophobia stiffwing:  DATA  Labs:  Stable labs  CXR 5/8 1. Moderate right and small to moderate left pleural effusions, increased from prior with bibasilar atelectasis or consolidation. 2. Cardiomegaly and persistent mild interstitial edema.    Scheduled Meds: . diltiazem  240 mg Oral Daily  . levalbuterol  0.63 mg Nebulization BID  . mouth rinse  15 mL Mouth Rinse BID  . mometasone-formoterol  2 puff Inhalation BID  . rivaroxaban  15 mg Oral Q supper   Continuous Infusions:   Principal Problem:   A-fib (HCC) Active Problems:   Asthma   Acute lower UTI   Hypoxia   Pleural effusion, bilateral   Goiter  AAA (abdominal aortic aneurysm) (HCC)   Compression fracture of body of thoracic vertebra (HCC)   Thyroid mass   Altered mental status   Pressure injury of skin   Lytic lesion of bone on x-ray   Goals of care,  counseling/discussion   Palliative care by specialist   LOS: 11 days

## 2019-02-27 ENCOUNTER — Inpatient Hospital Stay (HOSPITAL_COMMUNITY): Payer: Medicare Other

## 2019-02-27 MED ORDER — FUROSEMIDE 20 MG PO TABS
20.0000 mg | ORAL_TABLET | Freq: Every day | ORAL | Status: DC
  Administered 2019-02-27 – 2019-02-28 (×2): 20 mg via ORAL
  Filled 2019-02-27 (×2): qty 1

## 2019-02-27 NOTE — Progress Notes (Signed)
Pt verbalized that they choked on their breakfast. Pt has rhonchi in the upper lobes bilaterally, vitals are stable and O2 saturation currently at 97% with unlabored breathing. Pt is currently in no distress. Respiratory called and neb tx administered. Will continue to monitor pt.

## 2019-02-27 NOTE — Progress Notes (Signed)
Called to 1406 by RN.  Pt may have choked on her breakfast.  Pt given neb tx at this time. Worked with pt on flutter device and IS.  O2 sats at this time on 3 LPM 97%.  Congested cough.

## 2019-02-27 NOTE — Progress Notes (Addendum)
[facility] PROGRESS NOTE  Ranay Ketter ZOX:[id number] DOB: 16-Mar-1936 DOA: 02/15/2019 PCP: [name], MD  Narrative:  83 year old female Paroxysmal A. Fib CHd2vasc2=6 Asthma/COPD History of goiter not willing to have it RX per daughtyer report  Admit with toxic metabolic encephalopathy + A. fib RVR to stepdown on 02/16/2019-found to have enlarged thyroid with pressure at thoracic outlet as well as lytic bone compression fractures  A & Plan  A. fib with RVR on admission, chads score 6--initially on Cardizem drip cardiology has signed off Hypotension 2/2 Rate control Continue current Cardizem 240, Xarelto Patient became hypotensive and was not able to tolerate consolidation of metoprolol dosing Now only on Cardizem 240 daily and we will have to tolerate some tachycardia and monitor Acute hypoxic respiratory failure superimposed on Chronic respiratory failure--had been on oxygen at home ~ 2 years Iatrogenic fluid overload without heart failure Bilateral effusions Likely secondary to asthma/COPD and splinting secondary to thoracic fractures-supportive care only at this time Non compliant-removed pitcher--cannot eat so challenging with salt restrictions  2 L positive still --couldn't tolerate IV diuretics-trial PO cxr 5/10 shows stable atelectasis 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 Lindi Adie saw the patient in consult Palliative care saw the patient in consult in addition Thyroid mass is likely impinging on airway-she will continue to have airway issues but is adamant based on my discussions with her about no further surgery  Toxic metabolic encephalopathy superimposed on probable prior CVA and underlying multi-infarct dementia Seems to be resolved at this time [employer] [place] and would not treat given DNR status and lack of mortality benefit Prior CVA with expressive aphasia  Pyelonephritis--resolved     DVT  :Xarelto   Code Status: DNR communication: called Nevin Bloodgood and updated completely-she is aware of the impending health risk and decline in status and a guesstimated for daughter that life expectancy is probably a month to 2 months at most I have encouraged her to think about DNR DN H DNI when she goes to [place] and to advocate for her mom if that decline does occur She was grateful and was understanding of our plan Disposition Plan: likely d/c to SNF, [facility] in a.m.   [name], MD  [email or web address] 7PM-7AM contact night coverage as above 02/27/2019, 2:25 PM  LOS: 12 days   Consultants:   Cardiology: Dr. Harrell Gave 02/16/2019  Oncology: Dr. Lindi Adie 02/16/2019  Palliative care: Kathie Rhodes, NP 02/17/2019  Procedures:   2D echo 02/16/2019  Thyroid ultrasound 02/17/2019  Skeletal survey 02/16/2019  Ultrasound-guided FNA of indeterminate thyroid nodule per Dr. Laurence Ferrari, IR 02/18/2019  Antimicrobials:   IV Rocephin 02/16/2019>>>> 02/19/2019  Interval history/Subjective:  Awake alert coughing today after eating X-ray did not really show anything She states she is tired She is eating to some degree otherwise No chest pain No other issues  Objective:  Vitals:  Vitals:   02/27/19 1306 02/27/19 1330  BP: 105/81   Pulse: 94   Resp: 16   Temp: 98 F (36.7 C)   SpO2: (!) 85% 97%    Exam:  Chronically unwell appearing coherent t remains on oxygen EOMI NCAT No crackles no rales Abdomen soft no rebound no guarding No lower extremity edema Power 5/5    I have personally reviewed the folloNeck stiffness headache photophobia stiffwing:  DATA  Labs:  Stable labs  CXR 5/8 1. Moderate right and small to moderate left pleural effusions, increased from prior with bibasilar atelectasis  or consolidation. 2. Cardiomegaly and persistent mild interstitial edema.  cxr 5/10 IMPRESSION: Stable bilateral pleural  effusions with associated atelectasis.   Scheduled Meds: . diltiazem  240 mg Oral Daily  . levalbuterol  0.63 mg Nebulization BID  . mouth rinse  15 mL Mouth Rinse BID  . mometasone-formoterol  2 puff Inhalation BID  . rivaroxaban  15 mg Oral Q supper   Continuous Infusions:   Principal Problem:   A-fib (HCC) Active Problems:   Asthma   Acute lower UTI   Hypoxia   Pleural effusion, bilateral   Goiter   AAA (abdominal aortic aneurysm) (HCC)   Compression fracture of body of thoracic vertebra (HCC)   Thyroid mass   Altered mental status   Pressure injury of skin   Lytic lesion of bone on x-ray   Goals of care, counseling/discussion   Palliative care by specialist   LOS: 12 days

## 2019-02-28 LAB — RENAL FUNCTION PANEL
Albumin: 3.1 g/dL — ABNORMAL LOW (ref 3.5–5.0)
Anion gap: 9 (ref 5–15)
BUN: 26 mg/dL — ABNORMAL HIGH (ref 8–23)
CO2: 28 mmol/L (ref 22–32)
Calcium: 8.7 mg/dL — ABNORMAL LOW (ref 8.9–10.3)
Chloride: 104 mmol/L (ref 98–111)
Creatinine, Ser: 0.91 mg/dL (ref 0.44–1.00)
GFR calc Af Amer: >60 mL/min (ref >60)
GFR calc non Af Amer: 59 mL/min — ABNORMAL LOW (ref >60)
Glucose, Bld: 105 mg/dL — ABNORMAL HIGH (ref 70–99)
Phosphorus: 3.5 mg/dL (ref 2.5–4.6)
Potassium: 3.6 mmol/L (ref 3.5–5.1)
Sodium: 141 mmol/L (ref 135–145)

## 2019-02-28 MED ORDER — FUROSEMIDE 20 MG PO TABS: 20.0000 mg | ORAL_TABLET | Freq: Every day | ORAL | Status: AC

## 2019-02-28 MED ORDER — DILTIAZEM HCL ER COATED BEADS 240 MG PO CP24: 240.0000 mg | ORAL_CAPSULE | Freq: Every day | ORAL | Status: AC

## 2019-02-28 MED ORDER — BENZONATATE 100 MG PO CAPS: 100.0000 mg | ORAL_CAPSULE | Freq: Three times a day (TID) | ORAL | 0 refills | Status: AC | PRN

## 2019-02-28 MED ORDER — RIVAROXABAN 15 MG PO TABS: 15.0000 mg | ORAL_TABLET | Freq: Every day | ORAL | Status: AC

## 2019-02-28 NOTE — TOC Transition Note (Signed)
Transition of Care [facility]) - CM/SW Discharge Note   Patient Details  Name: Carla Kemp MRN: [id number] Date of Birth: 1936-03-30  Transition of Care [facility]) CM/SW Contact:  Dessa Phi, RN Phone  Number: 02/28/2019, 11:18 AM   Clinical Narrative: For d/c today to [facility] aware-patient is going to rm#[id number], nurse to call report-[phone]. PTAR called for 12 p/u. PTAR forms in shadow chart. No further CM needs.      Final next level of care: Skilled Nursing Facility Barriers to Discharge: No Barriers Identified   Patient Goals and CMS Choice Patient states their goals for this hospitalization and ongoing recovery are:: get rehab CMS Medicare.gov Compare Post Acute Care list provided to:: Patient Represenative (must comment)    Discharge Placement              Patient chooses bed at: [facility]) Patient to be transferred to facility by: Corey Harold) Name of family member notified: Sherol Dade Patient and family notified of of transfer: 02/28/19  Discharge Plan and Services   Discharge Planning Services: CM Consult                                 Social Determinants of Health (SDOH) Interventions     Readmission Risk Interventions No flowsheet data found.

## 2019-02-28 NOTE — Plan of Care (Signed)
  Problem: Health Behavior/Discharge Planning: Goal: Ability to manage health-related needs will improve Outcome: Adequate for Discharge   Problem: Clinical Measurements: Goal: Ability to maintain clinical measurements within normal limits will improve Outcome: Adequate for Discharge Goal: Will remain free from infection Outcome: Adequate for Discharge Goal: Diagnostic test results will improve Outcome: Adequate for Discharge Goal: Respiratory complications will improve Outcome: Adequate for Discharge Goal: Cardiovascular complication will be avoided Outcome: Adequate for Discharge   Problem: Activity: Goal: Risk for activity intolerance will decrease Outcome: Adequate for Discharge   Problem: Elimination: Goal: Will not experience complications related to bowel motility Outcome: Adequate for Discharge Goal: Will not experience complications related to urinary retention Outcome: Adequate for Discharge   Problem: Skin Integrity: Goal: Risk for impaired skin integrity will decrease Outcome: Adequate for Discharge   

## 2019-02-28 NOTE — Care Management Important Message (Signed)
Important Message  Patient Details IM Letter given to Dessa Phi RN to present to the Patient Name: [name] MRN: [id number] Date of Birth: 07-21-1936   Medicare Important Message Given:  Yes    Kerin Salen 02/28/2019, 11:52 AMImportant Message  Patient Details  Name: [name] MRN: [id number] Date of Birth: Jan 13, 1936   Medicare Important Message Given:  Yes    Kerin Salen 02/28/2019, 11:52 AM

## 2019-02-28 NOTE — TOC Progression Note (Signed)
Transition of Care [facility]) - Progression Note    Patient Details  Name: Carla Kemp MRN: [id number] Date of Birth: [birth year]  Transition of Care [facility]) CM/SW Contact  Dona Walby, Juliann Pulse, RN Phone Number: 02/28/2019, 11:32 AM  Clinical Narrative:  Patient will receive Palliative Care Services @ SNF-rep [facility] aware @ [facility],& noted on d/c summary.     Expected Discharge Plan: [place] Barriers to Discharge: No Barriers Identified  Expected Discharge Plan and Services Expected Discharge Plan: [place]   Discharge Planning Services: CM Consult   Living arrangements for the past 2 months: Single Family Home Expected Discharge Date: 02/28/19                                     Social Determinants of Health (SDOH) Interventions    Readmission Risk Interventions No flowsheet data found.

## 2019-02-28 NOTE — Progress Notes (Signed)
Report called to [facility] PTAR called for transport by Case Manager.

## 2019-02-28 NOTE — Progress Notes (Signed)
Daily Progress Note   Patient Name: Carla Kemp       Date: 02/28/19 DOB: January 26, 1936  Age: 83 y.o. MRN#: [id number] Attending Physician: Nita Sells, MD Primary Care Physician: [name], MD Admit Date: 02/15/2019  Reason for Consultation/Follow-up: Establishing goals of care  Subjective: Patient awake, alert, oriented. Denies pain or discomfort.   Dr. [name] at bedside discussing goals of care. Patient tearful and somewhat resistant to discussion this morning. She wishes she did not have to discharge to skilled nursing facility. She acknowledges that she does not wish to suffer, but also wishes to "live." Dr. [name] and I encouraged ongoing discussions with her family regarding [place] and EOL wishes. Plan is for discharge this afternoon to SNF.   Answered questions and concerns. Emotional/spiritual support provided along with therapeutic listening.    Length of Stay: 13  Current Medications: Scheduled Meds:  . diltiazem  240 mg Oral Daily  . furosemide  20 mg Oral Daily  . levalbuterol  0.63 mg Nebulization BID  . mouth rinse  15 mL Mouth Rinse BID  . mometasone-formoterol  2 puff Inhalation BID  . rivaroxaban  15 mg Oral Q supper    Continuous Infusions:   PRN Meds: acetaminophen, benzonatate, lip balm, metoprolol tartrate, ondansetron **OR** ondansetron (ZOFRAN) IV  Physical Exam          Vital Signs: BP 114/63 (BP Location: Left Arm)   Pulse 77   Temp 97.7 F (36.5 C) (Oral)   Resp 18   Ht '5\' 2"'$  (1.575 m)   Wt 58.5 kg   SpO2 97%   BMI 23.59 kg/m  SpO2: SpO2: 97 % O2 Device: O2 Device: Nasal Cannula O2 Flow Rate: O2 Flow Rate (L/min): 3 L/min  Intake/output summary:   Intake/Output Summary (Last 24 hours) at 02/28/2019 0951 Last data filed  at 02/28/2019 0900 Gross per 24 hour  Intake 318 ml  Output 350 ml  Net -32 ml   LBM: Last BM Date: 02/27/19 Baseline Weight: Weight: 54 kg Most recent weight: Weight: 58.5 kg       Palliative Assessment/Data: PPS 40%    Flowsheet Rows     Most Recent Value  Intake Tab  Referral Department  Hospitalist  Unit at Time of Referral  ICU  Palliative Care Primary Diagnosis  Cancer  Date  Notified  02/17/19  Palliative Care Type  New Palliative care  Reason for referral  Clarify Goals of Care  Date of Admission  02/15/19  Date first seen by Palliative Care  02/17/19  # of days Palliative referral response time  0 Day(s)  # of days IP prior to Palliative referral  2  Clinical Assessment  Palliative Performance Scale Score  40%  Psychosocial & Spiritual Assessment  Palliative Care Outcomes  Patient/Family meeting held?  Yes  Who was at the meeting?  daughter  [place] goals of care, Provided advance care planning, Provided psychosocial or spiritual support, Changed CPR status      Patient Active Problem List   Diagnosis Date Noted  . Lytic lesion of bone on x-ray   . Goals of care, counseling/discussion   . Palliative care by specialist   . A-fib ([place]) 02/16/2019  . Asthma 02/16/2019  . Acute lower UTI 02/16/2019  . Hypoxia 02/16/2019  . Pleural effusion, bilateral 02/16/2019  . Goiter 02/16/2019  . AAA (abdominal aortic aneurysm) ([place]) 02/16/2019  . Compression fracture of body of thoracic vertebra ([place]) 02/16/2019  . Thyroid mass 02/16/2019  . Altered mental status 02/16/2019  . Pressure injury of skin 02/16/2019    Palliative Care Assessment & Plan   Patient Profile: 83 y.o. female  with past medical history of HTN, a fib, CVA, and ?COPD admitted on 02/15/2019 with confusion and weakess. Was recently hospitalized for UTI and AMS. Found to have A fib RVR and UTI. CT head revealed small left parietal lytic bone lesion. CT chest revealed  bilateral pleural effusions. CXR revealed possible right mass vs atelectasis. Thyroid US revealed thyroid mass replacing most of the right lobe - FNA biopsy has been recommended. Diff dx per oncology include multiple myeloma, metastatic cancer, or benign changes. Per oncology, unable to tolerate any aggressive treatment options. PMT consulted for [place].  Assessment: Thyroid mass-FNA revealed hurthle cell neoplasm Lytic lesion of bone on x-ray Afib RVR Acute hypoxic respiratory failure  COPD on home oxygen Bilateral pleural effusions Prior CVA with expressive aphasia  Recommendations/Plan:  Plan for discharge today to SNF to attempt rehab.  Patient/family desire against aggressive oncology interventions and/or surgical intervention.   Recommend outpatient palliative referral for ongoing [place] discussion and transition to hospice when appropriate.   Code Status: DNR   Code Status Orders  (From admission, onward)         Start     Ordered   02/17/19 1537  Do not attempt resuscitation (DNR)  Continuous    Question Answer Comment  In the event of cardiac or respiratory ARREST Do not call a "code blue"   In the event of cardiac or respiratory ARREST Do not perform Intubation, CPR, defibrillation or ACLS   In the event of cardiac or respiratory ARREST Use medication by any route, position, wound care, and other measures to relive pain and suffering. May use oxygen, suction and manual treatment of airway obstruction as needed for comfort.      02/17/19 1536        Code Status History    Date Active Date Inactive Code Status Order ID Comments User Context   02/16/2019 0012 02/17/2019 1536 Full Code [id number]  Elwyn Reach, MD Inpatient       Prognosis:   Poor long-term prognosis  Discharge Planning:  [place] for rehab with Palliative care service follow-up  Care plan was discussed with patient, Dr. [name]  Thank you for allowing the Palliative Medicine  Team to assist in the care of this patient.   Time In: 0910 Time Out: 0935 Total Time 25 Prolonged Time Billed  no      Greater than 50%  of this time was spent counseling and coordinating care related to the above assessment and plan.  Ihor Dow, FNP-C Palliative Medicine Team  Phone: [phone] Fax: [phone]  Please contact Palliative Medicine Team phone at [phone] for questions and concerns.

## 2019-02-28 NOTE — Discharge Summary (Signed)
Physician Discharge Summary  Carla Kemp YPP:[id number] DOB: October 10, 1936 DOA: 02/15/2019  PCP: [name], MD  Admit date: 02/15/2019 Discharge date: 02/28/2019  Time spent: 35 minutes  Recommendations for Outpatient Follow-up:  1. Recommend discussion with Palliative and them following with patient as OP at facility 2. rec cmet and mag in 1-2 weeks 3. rec cxr 1 mo 4. Note dosage change cardizem 120-->240  Discharge Diagnoses:  Principal Problem:   A-fib [facility]) Active Problems:   Asthma   Acute lower UTI   Hypoxia   Pleural effusion, bilateral   Goiter   AAA (abdominal aortic aneurysm) (HCC)   Compression fracture of body of thoracic vertebra (HCC)   Thyroid mass   Altered mental status   Pressure injury of skin   Lytic lesion of bone on x-ray   Goals of care, counseling/discussion   Palliative care by specialist   Discharge Condition: gaurded  Diet recommendation: regular-liberalize diet to allow for calories  Filed Weights   02/26/19 0435 02/27/19 0500 02/28/19 0500  Weight: 61.5 kg 58.4 kg 58.5 kg    History of present illness:  83 year old female Paroxysmal A. fib Asthma/COPD History of goiter  Admit with toxic metabolic encephalopathy + A. fib RVR to stepdown on 02/16/2019-found to have enlarged thyroid with pressure at thoracic outlet as well as lytic bone compression fractures  Hospital Course:  A. fib with RVR on admission, chads score 6--initially on Cardizem drip cardiology has signed off Continue current Cardizem 240,  metoprolol 12.5 q 6--> Patient became hypotensive and was not able to tolerate consolidation of metoprolol dosing Patient now only on Cardizem 240 daily and we will have to tolerate some tachycardia and monitor Acute hypoxic respiratory failure superimposed on Chronic respiratory failure--had been on oxygen at home ~ 2 years Iatrogenic fluid overload without heart failure Bilateral effusions Likely secondary to asthma/COPD  and splinting secondary to thoracic fractures-supportive care only at this time Non compliant-removed pitcher--cannot eat so challenging with salt restrictions Patient has some urine output today but is 2 L positive still and will need further diuresis gently  and so Lasix has been completely discontinued given hypotension as above cxr in am 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 Lindi Adie saw the patient in consult Palliative care saw the patient in consult in addition Toxic metabolic encephalopathy superimposed on probable prior CVA and underlying multi-infarct dementia Seems to be resolved at this time AAA Coincidental and would not treat given DNR status and lack of mortality benefit Prior CVA with expressive aphasia  Pyelonephritis--resolved  Consultants:  Cardiology: Dr. Harrell Gave 02/16/2019  Oncology: Dr. Lindi Adie 02/16/2019  Palliative care: Kathie Rhodes, NP 02/17/2019  Procedures:  2D echo 02/16/2019  Thyroid ultrasound 02/17/2019  Skeletal survey 02/16/2019  Ultrasound-guided FNA of indeterminate thyroid nodule per Dr. Laurence Ferrari, IR 02/18/2019  Antimicrobials:   IV Rocephin 02/16/2019>>>> 02/19/2019  Discharge Exam: Vitals:   02/27/19 2157 02/28/19 0501  BP: 106/66 114/63  Pulse: 85 77  Resp: 20 18  Temp: 98.3 F (36.8 C) 97.7 F (36.5 C)  SpO2: 90% 97%    General: awake pleasant alert more voherent in no pain No fever Cardiovascular: s1 s 2 Irreg Irreg Respiratory: cta b abd soft nt nd no rebound No le edema  Discharge Instructions   Discharge Instructions    Diet - low sodium heart healthy   Complete by:  As directed    Increase activity slowly   Complete by:  As directed  Allergies as of 02/28/2019      Reactions   Amiodarone Other (See Comments)   Respiratory issues, hair loss      Medication List    STOP taking these medications   diltiazem 120 MG tablet Commonly known as:   CARDIZEM   potassium chloride 10 MEQ tablet Commonly known as:  K-DUR     TAKE these medications   albuterol (2.5 MG/3ML) 0.083% nebulizer solution Commonly known as:  PROVENTIL Take 3 mLs by nebulization every 6 (six) hours as needed for wheezing.   benzonatate 100 MG capsule Commonly known as:  TESSALON Take 1 capsule (100 mg total) by mouth 3 (three) times daily as needed for cough.   diltiazem 240 MG 24 hr capsule Commonly known as:  CARDIZEM CD Take 1 capsule (240 mg total) by mouth daily.   furosemide 20 MG tablet Commonly known as:  LASIX Take 1 tablet (20 mg total) by mouth daily. What changed:    medication strength  how much to take   Rivaroxaban 15 MG Tabs tablet Commonly known as:  XARELTO Take 1 tablet (15 mg total) by mouth daily with supper.      Allergies  Allergen Reactions  . Amiodarone Other (See Comments)    Respiratory issues, hair loss   Contact information for after-discharge care    Destination    [facility] Preferred SNF .   Service:  Skilled Nursing Contact information: [address] [place] [phone]               The results of significant diagnostics from this hospitalization (including imaging, microbiology, ancillary and laboratory) are listed below for reference.    Significant Diagnostic Studies: Dg Chest 2 View  Result Date: 02/27/2019 CLINICAL DATA:  Pleural effusion. EXAM: CHEST - 2 VIEW COMPARISON:  Radiographs of Feb 24, 2019. FINDINGS: Stable cardiomegaly. Stable bilateral pleural effusions are noted with probable underlying atelectasis. No pneumothorax is noted. Bony thorax is unremarkable. IMPRESSION: Stable bilateral pleural effusions with associated atelectasis. Electronically Signed   By: Marijo Conception M.D.   On: 02/27/2019 11:46   Dg Chest 2 View  Result Date: 02/24/2019 CLINICAL DATA:  Pleural effusion. EXAM: CHEST - 2 VIEW COMPARISON:  Metastatic bone survey  02/16/2019. Chest radiograph and CT 02/15/2019. FINDINGS: Assessment is limited by rightward patient rotation and kyphosis. The cardiac silhouette remains enlarged. There are moderate right and small to moderate left pleural effusions which have enlarged in the interim. There is associated atelectasis or consolidation in the lung bases, and there is persistent mild pulmonary vascular congestion and bilateral interstitial opacity. No pneumothorax is identified. Multiple thoracic compression fractures are again noted as previously described. IMPRESSION: 1. Moderate right and small to moderate left pleural effusions, increased from prior with bibasilar atelectasis or consolidation. 2. Cardiomegaly and persistent mild interstitial edema. Electronically Signed   By: Logan Bores M.D.   On: 02/24/2019 09:24   US Thyroid Fna Each Nodule  Result Date: 02/18/2019 INDICATION: Indeterminate thyroid nodule of the right thyroid. Request is made for fine need aspiration of indeterminate thyroid nodule. EXAM: ULTRASOUND GUIDED FINE NEEDLE ASPIRATION OF INDETERMINATE THYROID NODULE COMPARISON:  US THYROID 02/17/19 MEDICATIONS: 2 mL 1% lidocaine COMPLICATIONS: None immediate. TECHNIQUE: Informed written consent was obtained from the patient after a discussion of the risks, benefits and alternatives to treatment. Questions regarding the procedure were encouraged and answered. A timeout was performed prior to the initiation of the procedure. Pre-procedural ultrasound scanning demonstrated  unchanged size and appearance of the indeterminate nodule within the right thyroid. The procedure was planned. The neck was prepped in the usual sterile fashion, and a sterile drape was applied covering the operative field. A timeout was performed prior to the initiation of the procedure. Local anesthesia was provided with 1% lidocaine. Under direct ultrasound guidance, 6 FNA biopsies were performed of the right thyroid with a 25 gauge needle.  Multiple ultrasound images were saved for procedural documentation purposes. The samples were prepared and submitted to pathology. Limited post procedural scanning was negative for hematoma or additional complication. Dressings were placed. The patient tolerated the above procedures procedure well without immediate postprocedural complication. FINDINGS: Nodule reference number based on prior diagnostic ultrasound: None Maximum size: 8.0 cm Location: right; Reason for biopsy: patient/referrer request Ultrasound imaging confirms appropriate placement of the needles within the thyroid nodule. IMPRESSION: Technically successful ultrasound guided fine needle aspiration of indeterminate right thyroid nodule. Read by: Brynda Greathouse PA-C Electronically Signed   By: Jacqulynn Cadet M.D.   On: 02/18/2019 12:20   Dg Bone Survey Met  Result Date: 02/16/2019 CLINICAL DATA:  Lytic lesion of bone on prior x-ray EXAM: METASTATIC BONE SURVEY COMPARISON:  None Correlation CT head and CT chest 02/15/2019, chest radiograph 02/15/2019 FINDINGS: Enlargement of cardiac silhouette. Tracheal deviation RIGHT to LEFT by large RIGHT thyroid mass. Probable mild pulmonary edema with small BILATERAL small pleural effusions and minimal basilar atelectasis. Extensive atherosclerotic calcifications aorta. Diffuse osseous demineralization. Lytic lesion LEFT parietal calvarium 19 x 14 mm. Compression fractures are identified at T12, T9 T4 and T3, question insufficiency fractures of underlying pathologic lesions are not excluded. Questionable tiny lytic bone lesion versus artifact at mid LEFT femoral diaphysis. Old healed fracture mid LEFT fibular and distal LEFT radial fractures. Prior RIGHT olecranon ORIF. No additional lytic or sclerotic bone lesions identified. IMPRESSION: Lytic lesion LEFT parietal calvarium question lytic metastasis versus multiple myeloma. Multiple thoracic spine compression fractures which could be insufficiency  fractures no underlying pathologic lesions are not excluded, consider MR assessment. Questionable tiny lytic lesion versus artifact at the mid LEFT femoral diaphysis. CHF with small bibasilar pleural effusions and basilar atelectasis. RIGHT thyroid mass with tracheal deviation RIGHT to LEFT. Enlargement of cardiac silhouette. Electronically Signed   By: Lavonia Dana M.D.   On: 02/16/2019 12:45   US Thyroid  Result Date: 02/17/2019 CLINICAL DATA:  Thyroid mass, right EXAM: THYROID ULTRASOUND TECHNIQUE: Ultrasound examination of the thyroid gland and adjacent soft tissues was performed. COMPARISON:  CT 02/15/2019 FINDINGS: Parenchymal Echotexture: Normal (left); markedly heterogenous (right) Isthmus: 0.5 cm thickness Right lobe: 8 x 5.8 x 6.9 cm Left lobe: 5.1 x 2.5 x 1.8 cm _________________________________________________________ Estimated total number of nodules >/= 1 cm: 3 Number of spongiform nodules >/=  2 cm not described below (TR1): 0 Number of mixed cystic and solid nodules >/= 1.5 cm not described below (TR2): 0 _________________________________________________________ Right lobe is nearly completely replaced by complex mostly solid mass without calcifications; no appreciable normal right lobe thyroid tissue is evident Left findings: Nodule # 1: Location: Left; Superior Maximum size: 2.1 cm; Other 2 dimensions: 1.5 x 1.2 cm Composition: mixed cystic and solid (1) Echogenicity: isoechoic (1) Shape: not taller-than-wide (0) Margins: smooth (0) Echogenic foci: none (0) ACR TI-RADS total points: 2. ACR TI-RADS risk category: TR2 (2 points). ACR TI-RADS recommendations: This nodule does NOT meet TI-RADS criteria for biopsy or dedicated follow-up. _________________________________________________________ Nodule # 2: Location: Left; Mid Maximum size: 2.4 cm; Other 2  dimensions: 1.9 x 1.7 cm Composition: cystic/almost completely cystic (0) This nodule does NOT meet TI-RADS criteria for biopsy or dedicated  follow-up. _________________________________________________________ Nodule # 3: Location: Left; Inferior Maximum size: 1.2 cm; Other 2 dimensions: 1 cm Composition: cystic/almost completely cystic (0) This nodule does NOT meet TI-RADS criteria for biopsy or dedicated follow-up. _________________________________________________________ IMPRESSION: 1. Thyroid mass replacing most of the right lobe. Recommend FNA biopsy. 2. Smaller cystic nodules in the left lobe, none of which meet criteria for biopsy or dedicated imaging follow-up. The above is in keeping with the ACR TI-RADS recommendations - J Am Coll Radiol 2017;14:587-595. Electronically Signed   By: Lucrezia Europe M.D.   On: 02/17/2019 09:32    Microbiology: Recent Results (from the past 240 hour(s))  Novel Coronavirus, NAA (hospital order; send-out to ref lab)     Status: None   Collection Time: 02/22/19 12:34 PM  Result Value Ref Range Status   SARS-CoV-2, NAA NOT DETECTED NOT DETECTED Final    Comment: (NOTE) This test was developed and its performance characteristics determined by [employer]. This test has not been FDA cleared or approved. This test has been authorized by FDA under an Emergency Use Authorization (EUA). This test is only authorized for the duration of time the declaration that circumstances exist justifying the authorization of the emergency use of in vitro diagnostic tests for detection of SARS-CoV-2 virus and/or diagnosis of COVID-19 infection under section 564(b)(1) of the Act, 21 U.S.C. 706CBJ-6(E)(8), unless the authorization is terminated or revoked sooner. When diagnostic testing is negative, the possibility of a false negative result should be considered in the context of a patient's recent exposures and the presence of clinical signs and symptoms consistent with COVID-19. An individual without symptoms of COVID-19 and who is not shedding SARS-CoV-2 virus would expect to have a negative (not detected)  result in this assay. Performed  At: [facility] [place], [place] [id number] Rush Farmer MD VP:[id number]    [place]  Final    Comment: Performed at [facility] [address], [place],  [address]     Labs: Basic Metabolic Panel: Recent Labs  Lab 02/22/19 0545 02/24/19 0535 02/26/19 0523 02/28/19 0430  NA 135 134* 138 141  K 4.2 4.8 3.8 3.6  CL 104 102 99 104  CO2 '23 24 28 28  '$ GLUCOSE 111* 110* 104* 105*  BUN 26* 24* 27* 26*  CREATININE 0.90 0.88 0.97 0.91  CALCIUM 8.7* 9.2 8.8* 8.7*  PHOS  --   --  4.5 3.5   Liver Function Tests: Recent Labs  Lab 02/26/19 0523 02/28/19 0430  ALBUMIN 3.3* 3.1*   No results for input(s): LIPASE, AMYLASE in the last 168 hours. No results for input(s): AMMONIA in the last 168 hours. CBC: Recent Labs  Lab 02/22/19 0545 02/24/19 0535 02/26/19 0523  WBC 5.9 6.5 6.0  NEUTROABS  --  4.8 4.2  HGB 10.5* 11.0* 11.3*  HCT 37.5 40.4 40.6  MCV 83.5 84.5 83.5  PLT 164 233 243   Cardiac Enzymes: No results for input(s): CKTOTAL, CKMB, CKMBINDEX, TROPONINI in the last 168 hours. BNP: BNP (last 3 results) No results for input(s): BNP in the last 8760 hours.  ProBNP (last 3 results) No results for input(s): PROBNP in the last 8760 hours.  CBG: No results for input(s): GLUCAP in the last 168 hours.     Signed:  Nita Sells MD   [facility] 02/28/2019, 9:21 AM

## 2019-03-03 ENCOUNTER — Non-Acute Institutional Stay: Payer: Medicare Other | Admitting: Adult Health Nurse Practitioner

## 2019-03-03 DIAGNOSIS — Z515 Encounter for palliative care: Secondary | ICD-10-CM

## 2019-03-04 ENCOUNTER — Other Ambulatory Visit: Payer: Self-pay

## 2019-03-04 NOTE — Progress Notes (Signed)
[facility] Consult Note Telephone: [phone]  Fax: [phone]  PATIENT NAME: Carla Kemp DOB: 1935-11-22 MRN: [id number]  PRIMARY CARE PROVIDER:   [name], MD.  Being seen by Dr. Mariel Craft NP while at [facility]  REFERRING PROVIDER: Dr. Mariel Craft NP  RESPONSIBLE PARTY:   Sherol Dade, daughter  Cell: [phone]; home: [phone]     RECOMMENDATIONS and PLAN:  1.  AMS.  Patient recently hospitalized due to metabolic encephalopathy.Found to have A fib RVR and UTI. CT of head revealed small left parietal lytic bone lesion. CT of chest revealed bilateral pleural effusions. CXR revealed possible right mass vs atelectasis. Thyroid US revealed thyroid mass replacing most of the right lobe. FNA showed hurthle cell neoplasm.  Patient unlikely to tolerate any aggressive treatment options. Patient also has multi-infarct dementia.  She is A&O x3 but is a poor historian and gets a little flustered when she doesn't know how to answer a question.      2. Appetite.  Patient states having a poor appetite.  Denies any abdominal pain and is uncertain of LBM.  Hospital notes in [place] do indicate that daughter has stated that her appetite does fluctuate but overall her appetite has decreased. Daughter reports that whenever she is in the hospital or a facility she doesn't eat well because she does not like the food.  Though when she was at home she would eat what her daughter fixed. Patient also has some aphasia post CVA.  Recommend starting Remeron to stimulate appetite and offering or scheduling supplemental drinks like Ensure.  Weekly weights to monitor for weight loss.  3. Mobility.  Patient is able to ambulate with a walker and is currently working with PT/OT. No reported falls at facility.  Patient does state feeling weak.  Denies any pain.  Continue PT/OT as ordered  4.  Edema.  Daughter does state that at home  she usually wears knee high TED hose and would like to bring in her pair from home so she could wear them.  Continue wearing knee high TED hose as at home to help with edema  5.  Goals of care. Patient is DNR.  Spoke with daughter on the phone and states that the goal is come back home with her once she gets her strength back.  Daughter states that patient has voiced that she does not want to go through treatment for the hurthle cell neoplasm.  Patient has stated to her daughter that she has lived her life and she is ready to be with her husband who passed away about 7 years ago.  I spent 60 minutes providing this consultation,  from 3:30 to 4:30. More than 50% of the time in this consultation was spent coordinating communication.   HISTORY OF PRESENT ILLNESS:  Carla Kemp is a 83 y.o. year old female with multiple medical problems including a-fib, AAA, asthma, thyroid mass, HTN, COPD, dementia, aphasia post CVA. Palliative Care was asked to help address goals of care.   CODE STATUS: DNR  PPS: 40% HOSPICE ELIGIBILITY/DIAGNOSIS: TBD  PHYSICAL EXAM:   General: patient lying in bed in NAD Cardiovascular: irregular rate and rhythm Pulmonary: lung sounds clear, normal respiratory effort Abdomen: soft, nontender, + bowel sounds Extremities: 1+ edema to bilateral feet, no joint deformities Skin: no rashes Neurological: Weakness but otherwise nonfocal.  Patient is A&O x3 but when asked questions about her health or her time in the hospital she states that she doesn't  know or isn't sure.  Poor historian   PAST MEDICAL HISTORY:  Past Medical History:  Diagnosis Date  . Asthma   . Atrial fibrillation ([place])   . Chronic kidney disease   . COPD (chronic obstructive pulmonary disease) ([place])   . Coronary artery disease   . Dementia ([place])   . Hypertension   . Stroke [facility])     SOCIAL HX:  Social History   Tobacco Use  . Smoking status: Never Smoker  . Smokeless tobacco: Never Used   Substance Use Topics  . Alcohol use: Not on file    ALLERGIES:  Allergies  Allergen Reactions  . Amiodarone Other (See Comments)    Respiratory issues, hair loss     PERTINENT MEDICATIONS:  Outpatient Encounter Medications as of 03/03/2019  Medication Sig  . albuterol (PROVENTIL) (2.5 MG/3ML) 0.083% nebulizer solution Take 3 mLs by nebulization every 6 (six) hours as needed for wheezing.  . benzonatate (TESSALON) 100 MG capsule Take 1 capsule (100 mg total) by mouth 3 (three) times daily as needed for cough.  . diltiazem (CARDIZEM CD) 240 MG 24 hr capsule Take 1 capsule (240 mg total) by mouth daily.  . furosemide (LASIX) 20 MG tablet Take 1 tablet (20 mg total) by mouth daily.  . Rivaroxaban (XARELTO) 15 MG TABS tablet Take 1 tablet (15 mg total) by mouth daily with supper.   No facility-administered encounter medications on file as of 03/03/2019.       Amy Jenetta Downer, NP

## 2019-03-21 DEATH — deceased

## 2019-03-24 ENCOUNTER — Encounter (HOSPITAL_COMMUNITY): Payer: Self-pay

## 2021-03-18 IMAGING — DX METASTATIC BONE SURVEY
8 of 10 series · 8 of 10 positions shown · non-contrast
Comparison: None

Correlation CT head and CT chest 02/15/2019, chest radiograph
02/15/2019

CLINICAL DATA: Lytic lesion of bone on prior x-ray

EXAM:
METASTATIC BONE SURVEY

[skull lat]
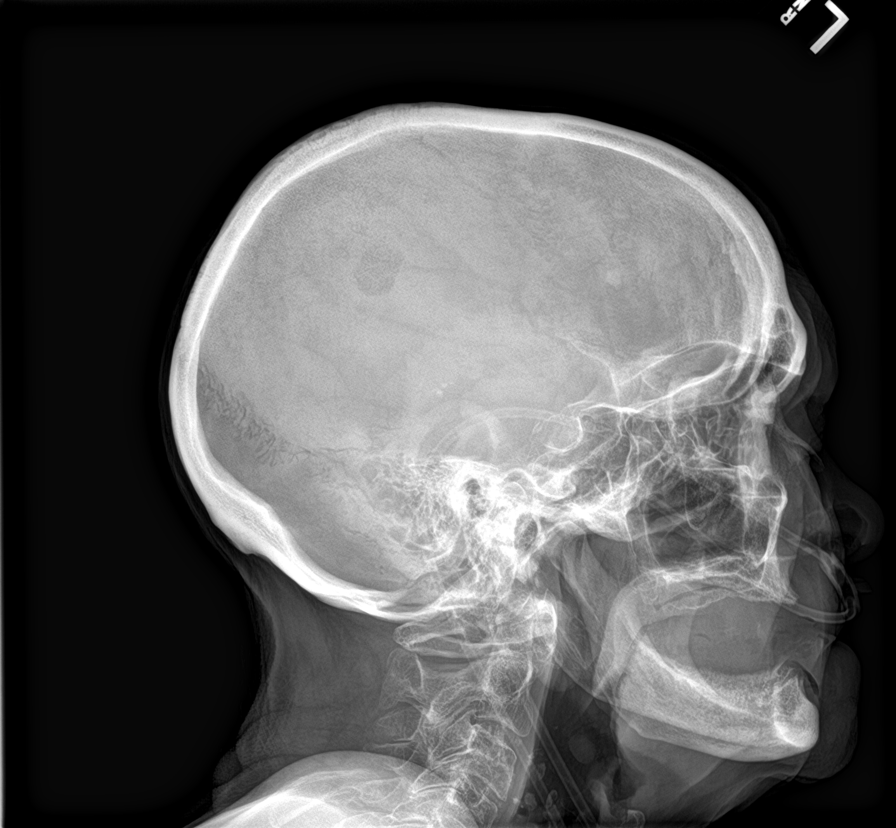

[shoulder ap (1 of 2)]
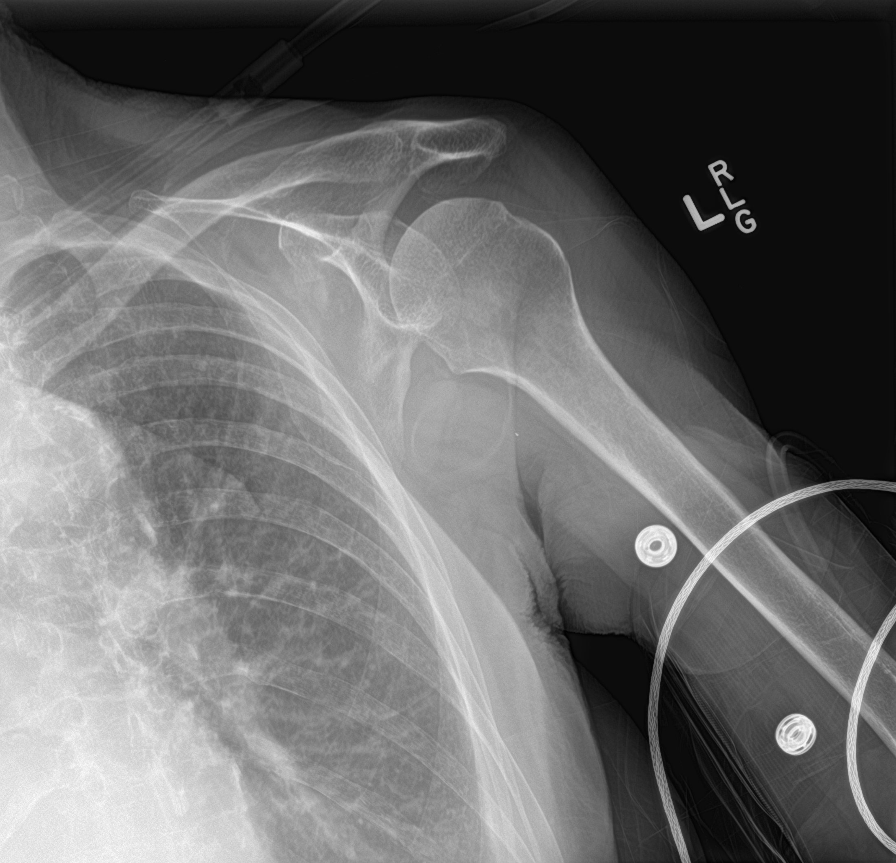

[shoulder ap (2 of 2)]
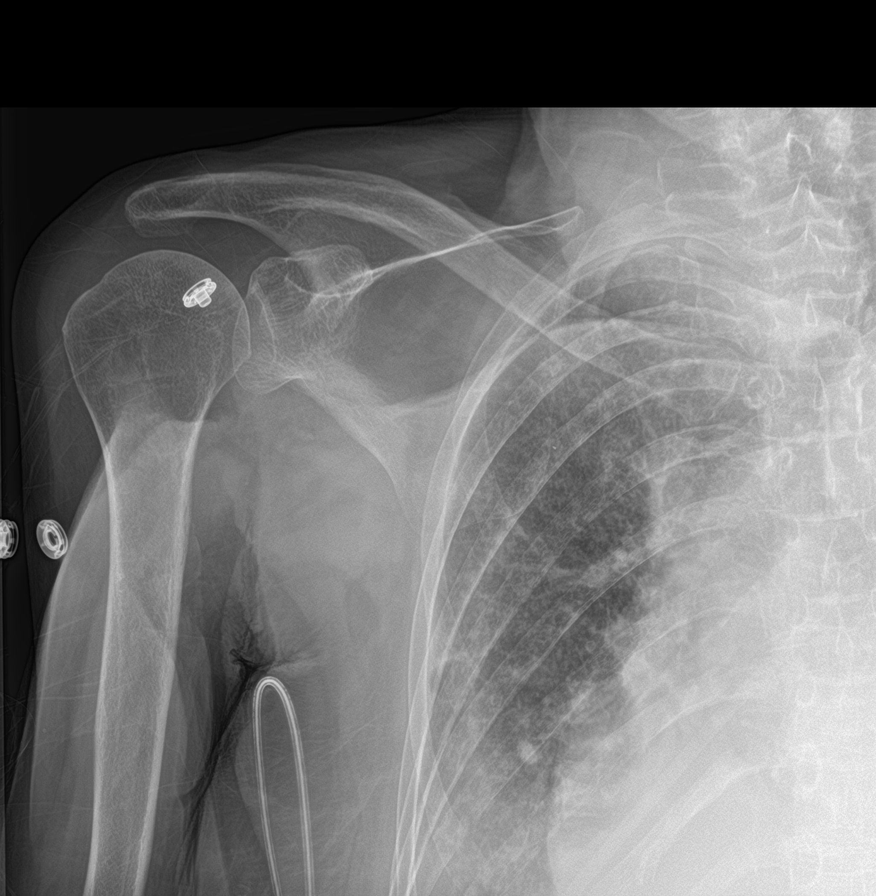

[humerus ap (1 of 2)]
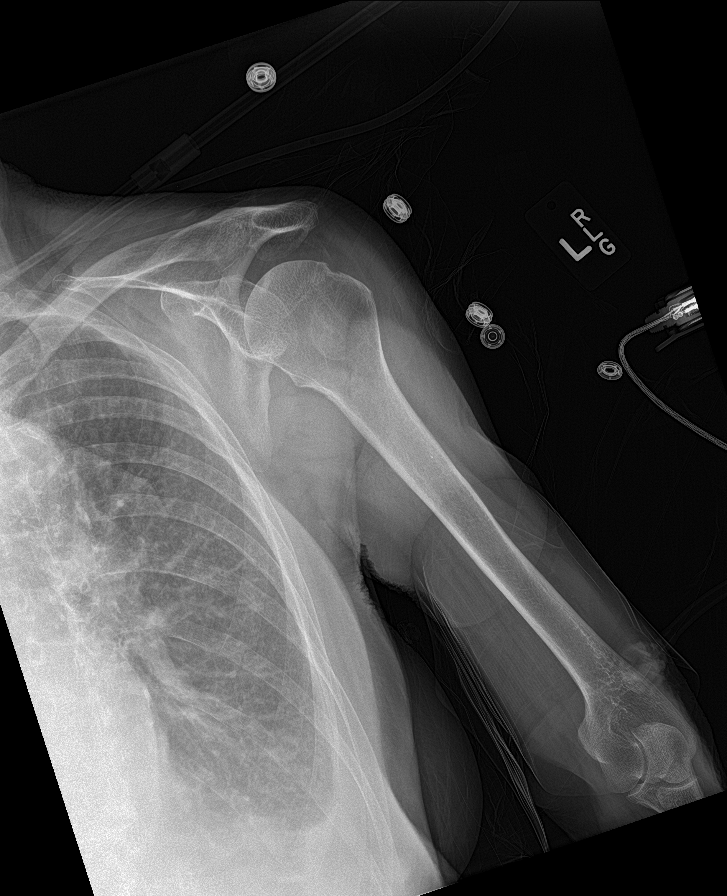

[humerus ap (2 of 2)]
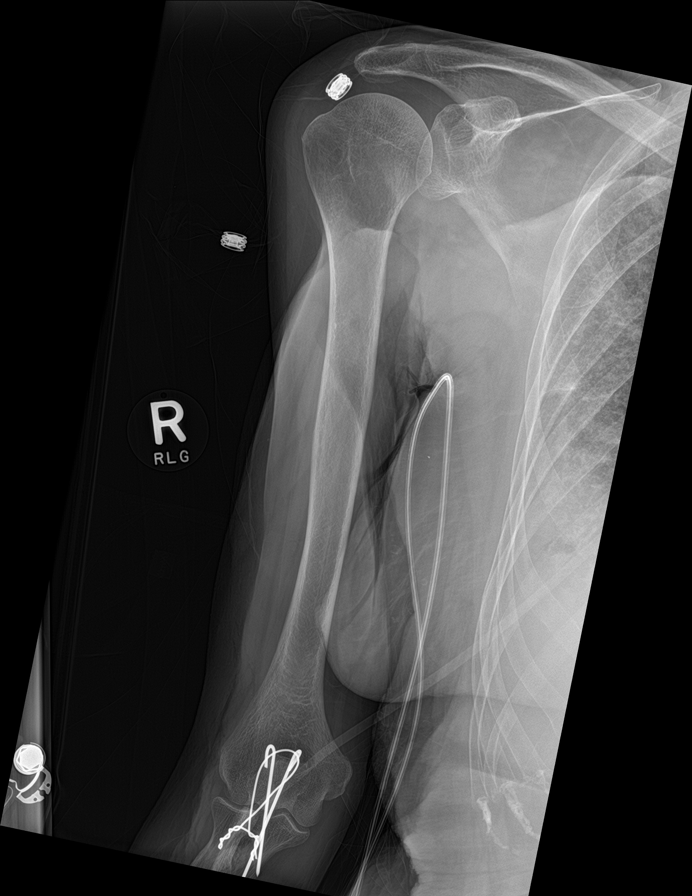

[forearm ap (1 of 2)]
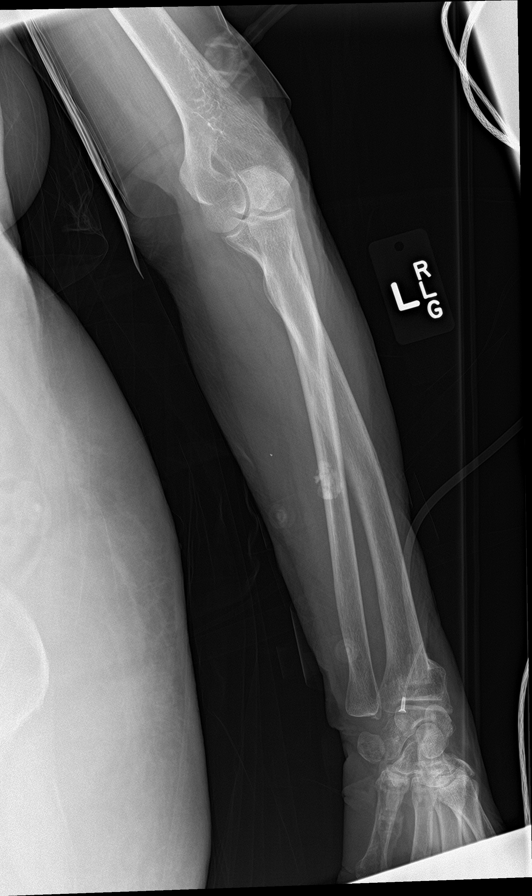

[forearm ap (2 of 2)]
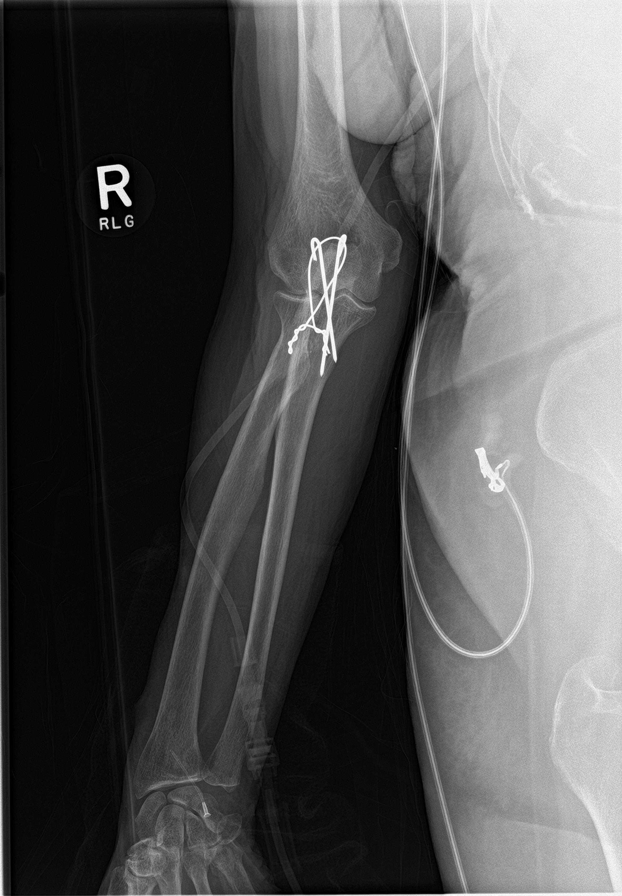

[c-spine ap]
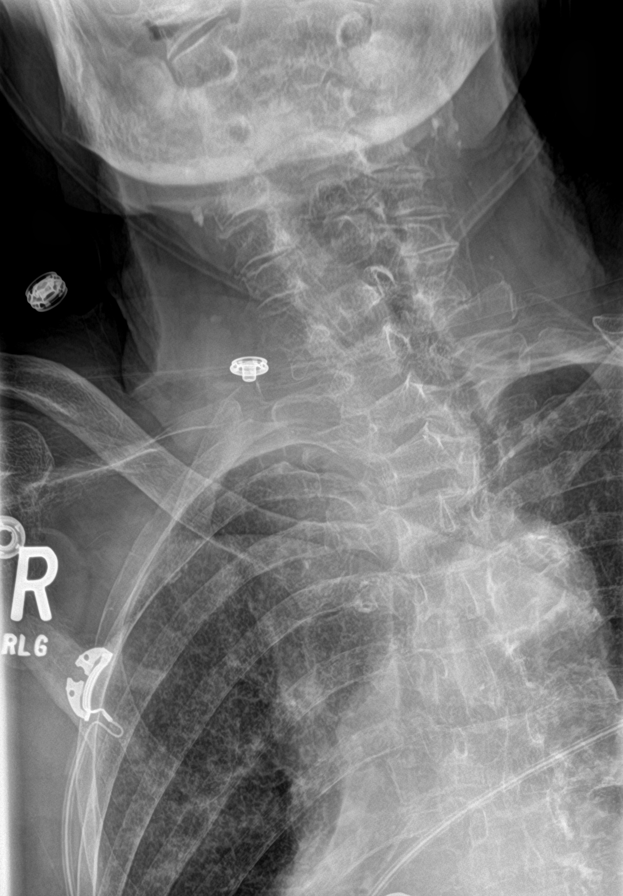

[8 of 10 positions shown; findings below may reference images not displayed]

FINDINGS: Enlargement of cardiac silhouette.

Tracheal deviation RIGHT to LEFT by large RIGHT thyroid mass.

Probable mild pulmonary edema with small BILATERAL small pleural
effusions and minimal basilar atelectasis.

Extensive atherosclerotic calcifications aorta.

Diffuse osseous demineralization.

Lytic lesion LEFT parietal calvarium 19 x 14 mm.

Compression fractures are identified at T12, T9 T4 and T3, question
insufficiency fractures of underlying pathologic lesions are not
excluded.

Questionable tiny lytic bone lesion versus artifact at mid LEFT
femoral diaphysis.

Old healed fracture mid LEFT fibular and distal LEFT radial
fractures.

Prior RIGHT olecranon ORIF.

No additional lytic or sclerotic bone lesions identified.
IMPRESSION: Lytic lesion LEFT parietal calvarium question lytic metastasis
versus multiple myeloma.

Multiple thoracic spine compression fractures which could be
insufficiency fractures no underlying pathologic lesions are not
excluded, consider MR assessment.

Questionable tiny lytic lesion versus artifact at the mid LEFT
femoral diaphysis.

CHF with small bibasilar pleural effusions and basilar atelectasis.

RIGHT thyroid mass with tracheal deviation RIGHT to LEFT.

Enlargement of cardiac silhouette.
